# Patient Record
Sex: Male | Born: 1960 | Race: White | Hispanic: No | Smoking: Former smoker
Health system: Southern US, Community
[De-identification: ages and names within clinical notes are randomized; demographics above are authoritative.]

## PROBLEM LIST (undated history)

## (undated) DIAGNOSIS — I10 Essential (primary) hypertension: Secondary | ICD-10-CM

## (undated) DIAGNOSIS — Z972 Presence of dental prosthetic device (complete) (partial): Secondary | ICD-10-CM

## (undated) DIAGNOSIS — Z9289 Personal history of other medical treatment: Secondary | ICD-10-CM

## (undated) DIAGNOSIS — Z8619 Personal history of other infectious and parasitic diseases: Secondary | ICD-10-CM

## (undated) DIAGNOSIS — R300 Dysuria: Secondary | ICD-10-CM

## (undated) HISTORY — DX: Personal history of other infectious and parasitic diseases: Z86.19

## (undated) HISTORY — DX: Dysuria: R30.0

## (undated) HISTORY — DX: Personal history of other medical treatment: Z92.89

---

## 1978-09-13 DIAGNOSIS — F17210 Nicotine dependence, cigarettes, uncomplicated: Secondary | ICD-10-CM | POA: Insufficient documentation

## 1998-09-13 DIAGNOSIS — I10 Essential (primary) hypertension: Secondary | ICD-10-CM | POA: Insufficient documentation

## 2003-09-14 DIAGNOSIS — F39 Unspecified mood [affective] disorder: Secondary | ICD-10-CM | POA: Insufficient documentation

## 2004-09-13 HISTORY — PX: TIBIA FRACTURE SURGERY: SHX806

## 2004-12-13 ENCOUNTER — Emergency Department: Payer: Self-pay | Admitting: Emergency Medicine

## 2004-12-16 ENCOUNTER — Ambulatory Visit: Payer: Self-pay | Admitting: Unknown Physician Specialty

## 2008-02-01 DIAGNOSIS — K644 Residual hemorrhoidal skin tags: Secondary | ICD-10-CM | POA: Insufficient documentation

## 2008-12-16 DIAGNOSIS — M129 Arthropathy, unspecified: Secondary | ICD-10-CM | POA: Insufficient documentation

## 2009-01-03 DIAGNOSIS — E119 Type 2 diabetes mellitus without complications: Secondary | ICD-10-CM | POA: Insufficient documentation

## 2009-01-03 DIAGNOSIS — E1121 Type 2 diabetes mellitus with diabetic nephropathy: Secondary | ICD-10-CM | POA: Insufficient documentation

## 2009-10-27 DIAGNOSIS — M5430 Sciatica, unspecified side: Secondary | ICD-10-CM | POA: Insufficient documentation

## 2011-05-29 ENCOUNTER — Ambulatory Visit: Payer: Self-pay | Admitting: Family Medicine

## 2011-05-29 DIAGNOSIS — Z9289 Personal history of other medical treatment: Secondary | ICD-10-CM

## 2011-05-29 DIAGNOSIS — M5020 Other cervical disc displacement, unspecified cervical region: Secondary | ICD-10-CM | POA: Insufficient documentation

## 2011-05-29 HISTORY — DX: Personal history of other medical treatment: Z92.89

## 2013-05-03 ENCOUNTER — Ambulatory Visit: Payer: Self-pay | Admitting: Unknown Physician Specialty

## 2013-05-03 LAB — HM COLONOSCOPY

## 2013-05-04 LAB — PATHOLOGY REPORT

## 2013-06-21 ENCOUNTER — Ambulatory Visit: Payer: Self-pay | Admitting: Family Medicine

## 2013-07-14 HISTORY — PX: ANTERIOR CERVICAL DISCECTOMY: SHX1160

## 2014-02-28 LAB — BASIC METABOLIC PANEL
BUN: 12 mg/dL (ref 4–21)
Creatinine: 0.9 mg/dL (ref <=1.3)
Glucose: 136 mg/dL
Potassium: 5.3 mmol/L (ref 3.4–5.3)
Sodium: 138 mmol/L (ref 137–147)

## 2014-02-28 LAB — LIPID PANEL
Cholesterol: 149 mg/dL (ref 0–200)
HDL: 59 mg/dL (ref 35–70)
LDL Cholesterol: 77 mg/dL
LDl/HDL Ratio: 1.3
Triglycerides: 64 mg/dL (ref 40–160)

## 2014-02-28 LAB — PSA: PSA: 0.4

## 2014-08-19 LAB — HEMOGLOBIN A1C: Hgb A1c MFr Bld: 5.3 % (ref 4.0–6.0)

## 2014-12-04 LAB — TSH: TSH: 0.92 u[IU]/mL (ref <=5.90)

## 2015-01-02 DIAGNOSIS — N529 Male erectile dysfunction, unspecified: Secondary | ICD-10-CM | POA: Insufficient documentation

## 2015-01-02 DIAGNOSIS — L089 Local infection of the skin and subcutaneous tissue, unspecified: Secondary | ICD-10-CM | POA: Insufficient documentation

## 2015-01-02 DIAGNOSIS — G4700 Insomnia, unspecified: Secondary | ICD-10-CM | POA: Insufficient documentation

## 2015-01-02 DIAGNOSIS — J309 Allergic rhinitis, unspecified: Secondary | ICD-10-CM | POA: Insufficient documentation

## 2015-01-02 DIAGNOSIS — R809 Proteinuria, unspecified: Secondary | ICD-10-CM | POA: Insufficient documentation

## 2015-01-02 DIAGNOSIS — T148XXA Other injury of unspecified body region, initial encounter: Secondary | ICD-10-CM

## 2015-01-03 ENCOUNTER — Encounter: Payer: Self-pay | Admitting: Family Medicine

## 2015-01-03 DIAGNOSIS — Z86010 Personal history of colonic polyps: Secondary | ICD-10-CM | POA: Insufficient documentation

## 2015-01-03 DIAGNOSIS — M5412 Radiculopathy, cervical region: Secondary | ICD-10-CM | POA: Insufficient documentation

## 2015-02-18 ENCOUNTER — Encounter: Payer: Self-pay | Admitting: Family Medicine

## 2015-02-18 ENCOUNTER — Ambulatory Visit (INDEPENDENT_AMBULATORY_CARE_PROVIDER_SITE_OTHER): Payer: BLUE CROSS/BLUE SHIELD | Admitting: Family Medicine

## 2015-02-18 VITALS — BP 118/70 | HR 79 | Temp 98.5°F | Resp 16 | Wt 174.0 lb

## 2015-02-18 DIAGNOSIS — E119 Type 2 diabetes mellitus without complications: Secondary | ICD-10-CM

## 2015-02-18 LAB — POCT GLYCOSYLATED HEMOGLOBIN (HGB A1C)
Est. average glucose Bld gHb Est-mCnc: 105
Hemoglobin A1C: 5.3

## 2015-02-18 NOTE — Progress Notes (Signed)
Patient: Antonio Howe Male    DOB: 16-Jan-1961   54 y.o.   MRN: [id number] Visit Date: 02/18/2015  Today's Provider: Lelon Huh, MD   Chief Complaint  Patient presents with  . Hypertension    6 month follow-up  . Diabetes    6 month follow-up   Subjective:    Hypertension This is a chronic problem. The current episode started more than 1 year ago. The problem is unchanged. The problem is controlled. Associated symptoms include malaise/fatigue. Pertinent negatives include no chest pain, headaches or shortness of breath. The current treatment provides moderate improvement.  Diabetes Pertinent negatives for hypoglycemia include no headaches. Pertinent negatives for diabetes include no chest pain.       Hypertension, follow-up: BP Readings from Last 3 Encounters:  02/18/15 118/70  12/04/14 120/78     He was last seen for hypertension 1 years ago.   BP at that visit was 128/72.  Management changes since that visit include none. He reports good compliance with treatment.  He is not having side effects. none  He is not exercising.  He is not adherent to low salt diet.    Outside blood pressures are 124/72.  He is experiencing none.   Patient denies none.    Cardiovascular risk factors include diabetes mellitus, dyslipidemia and hypertension.  Use of agents associated with hypertension: none.    Weight trend: stable Current diet: in general, a "healthy" diet    ------------------------------------------------------------------------     Diabetes Mellitus Type II, Follow-up:   Lab Results  Component Value Date   HGBA1C 5.3 08/19/2014    Last seen for diabetes 6 months ago.  Management changes included none. He reports good compliance with treatment. He is not having side effects. none Current symptoms include none.  Episodes of hypoglycemia? no   Current diet: diabetic Current exercise: none  Pertinent Labs:    Component Value Date/Time   CHOL 149  02/28/2014   TRIG 64 02/28/2014   CREATININE 0.9 02/28/2014    Wt Readings from Last 3 Encounters:  02/18/15 174 lb (78.926 kg)  12/04/14 165 lb (74.844 kg)    ------------------------------------------------------------------------    Previous Medications   ASPIRIN 81 MG TABLET    Take 1 tablet by mouth daily.   B COMPLEX VITAMINS PO    Take 1 capsule by mouth daily.   CYANOCOBALAMIN 100 MCG TABLET    Take 1 tablet by mouth daily.   DIAZEPAM (VALIUM) 5 MG TABLET    Take 1-2 tablets by mouth daily as needed.   GLIPIZIDE (GLUCOTROL XL) 2.5 MG 24 HR TABLET    Take 1 tablet by mouth daily.   GLUCOSE BLOOD TEST STRIP    1 strip by Does not apply route daily.   HYDROCODONE-ACETAMINOPHEN (NORCO) 7.5-325 MG PER TABLET    Take 1 tablet by mouth every 4 (four) hours as needed.   LISINOPRIL-HYDROCHLOROTHIAZIDE (PRINZIDE,ZESTORETIC) 10-12.5 MG PER TABLET    Take 1 tablet by mouth daily.   MELOXICAM (MOBIC) 15 MG TABLET    Take 1 tablet by mouth daily.   SILDENAFIL (REVATIO) 20 MG TABLET    Take 5 tablets by mouth. 30 minutes prior to intercoarse   VARDENAFIL (LEVITRA) 20 MG TABLET    Take 1 tablet by mouth. As needed, no more than one in a day    Review of Systems  Constitutional: Positive for malaise/fatigue.  Respiratory: Negative for shortness of breath.   Cardiovascular: Negative for chest pain.  Neurological:  Negative for headaches.    History  Substance Use Topics  . Smoking status: Current Every Day Smoker -- 1.50 packs/day for 30 years    Types: Cigarettes  . Smokeless tobacco: Not on file  . Alcohol Use: Yes     Comment: Moderate alcohol use   Objective:   BP 118/70 mmHg  Pulse 79  Temp(Src) 98.5 F (36.9 C) (Oral)  Resp 16  Wt 174 lb (78.926 kg)  SpO2 97%  Physical Exam   General Appearance:    Alert, cooperative, no distress  Eyes:    PERRL, conjunctiva/corneas clear, EOM's intact       Lungs:     Clear to auscultation bilaterally, respirations unlabored   Heart:    Regular rate and rhythm  Neurologic:   Awake, alert, oriented x 3. No apparent focal neurological           defect.              Assessment & Plan:     1. Type 2 diabetes mellitus without complication  - POCT HgB A1C  2. Counseled to stop smoking which he states he is not ready to do yet.

## 2015-02-25 ENCOUNTER — Telehealth: Payer: Self-pay

## 2015-02-25 NOTE — Telephone Encounter (Signed)
Patient is requesting refill on Hydrocodone. Please call [phone] when ready for pick up.

## 2015-02-26 MED ORDER — HYDROCODONE-ACETAMINOPHEN 7.5-325 MG PO TABS: 1.0000 | ORAL_TABLET | ORAL | Status: DC | PRN

## 2015-03-27 ENCOUNTER — Other Ambulatory Visit: Payer: Self-pay | Admitting: Family Medicine

## 2015-03-27 NOTE — Telephone Encounter (Signed)
HYDROcodone-acetaminophen (NORCO) 7.5-325 MG per tablet 02/26/15 -- Birdie Sons, MD Take 1 tablet by mouth every 4 (four) hours as needed. refill to be picked up.  Will be out tomorrow.

## 2015-03-28 MED ORDER — HYDROCODONE-ACETAMINOPHEN 7.5-325 MG PO TABS: 1.0000 | ORAL_TABLET | ORAL | Status: DC | PRN

## 2015-03-28 NOTE — Telephone Encounter (Signed)
Pt will be out of his medication today.  Can someone else write this RX for him.  thanks TP

## 2015-03-28 NOTE — Telephone Encounter (Signed)
Printed.  Please notify patient. Thanks.  

## 2015-04-24 ENCOUNTER — Telehealth: Payer: Self-pay | Admitting: Family Medicine

## 2015-04-24 MED ORDER — HYDROCODONE-ACETAMINOPHEN 7.5-325 MG PO TABS: 1.0000 | ORAL_TABLET | ORAL | Status: DC | PRN

## 2015-04-24 NOTE — Telephone Encounter (Signed)
Please review. Thanks!  

## 2015-04-24 NOTE — Telephone Encounter (Signed)
Patient is requesting his HYDROcodone-acetaminophen (NORCO) 7.5-325 MG per tablet

## 2015-05-21 ENCOUNTER — Other Ambulatory Visit: Payer: Self-pay | Admitting: Family Medicine

## 2015-05-21 MED ORDER — HYDROCODONE-ACETAMINOPHEN 7.5-325 MG PO TABS: 1.0000 | ORAL_TABLET | ORAL | Status: DC | PRN

## 2015-05-21 NOTE — Telephone Encounter (Signed)
HYDROcodone-acetaminophen (NORCO) 7.5-325 MG per tablet 04/24/15 -- Birdie Sons, MD Take 1 tablet by mouth every 4 (four) hours as needed.  would like to pick it up early to take to the pharmacy so it will be ready when he gets back from vacation.  He is not out yet.   call back [phone]  Thanks, Con Memos

## 2015-06-20 ENCOUNTER — Other Ambulatory Visit: Payer: Self-pay | Admitting: Family Medicine

## 2015-06-23 ENCOUNTER — Other Ambulatory Visit: Payer: Self-pay | Admitting: Family Medicine

## 2015-06-23 NOTE — Telephone Encounter (Signed)
Pt contacted office for refill request on the following medications: HYDROcodone-acetaminophen (NORCO) 7.5-325 MG per tablet. Pt would like to pick this up this afternoon. Thanks TNP

## 2015-06-24 MED ORDER — HYDROCODONE-ACETAMINOPHEN 7.5-325 MG PO TABS: 1.0000 | ORAL_TABLET | ORAL | Status: DC | PRN

## 2015-07-04 ENCOUNTER — Other Ambulatory Visit: Payer: Self-pay | Admitting: Family Medicine

## 2015-07-05 NOTE — Telephone Encounter (Signed)
Please call in diazepam.  

## 2015-07-06 ENCOUNTER — Other Ambulatory Visit: Payer: Self-pay | Admitting: Family Medicine

## 2015-07-06 NOTE — Telephone Encounter (Signed)
Please call in diazepam.  

## 2015-07-07 NOTE — Telephone Encounter (Signed)
Rx called in to pharmacy. 

## 2015-07-23 ENCOUNTER — Other Ambulatory Visit: Payer: Self-pay | Admitting: Family Medicine

## 2015-07-23 NOTE — Telephone Encounter (Signed)
Pt contacted office for refill request on the following medications: HYDROcodone-acetaminophen (NORCO) 7.5-325 MG tablet. Thanks TNP

## 2015-07-24 MED ORDER — HYDROCODONE-ACETAMINOPHEN 7.5-325 MG PO TABS: 1.0000 | ORAL_TABLET | ORAL | Status: DC | PRN

## 2015-08-21 ENCOUNTER — Other Ambulatory Visit: Payer: Self-pay | Admitting: Family Medicine

## 2015-08-21 MED ORDER — HYDROCODONE-ACETAMINOPHEN 7.5-325 MG PO TABS: 1.0000 | ORAL_TABLET | ORAL | Status: DC | PRN

## 2015-08-21 NOTE — Telephone Encounter (Signed)
Pt contacted office for refill request on the following medications:  HYDROcodone-acetaminophen (NORCO) 7.5-325 MG tablet.  CB#[phone]/MW

## 2015-09-17 ENCOUNTER — Other Ambulatory Visit: Payer: Self-pay | Admitting: Family Medicine

## 2015-09-17 NOTE — Telephone Encounter (Signed)
Pt contacted office for refill request on the following medications: HYDROcodone-acetaminophen (NORCO) 7.5-325 MG tablet. Pt would like to pick this up by Friday 09/19/15 b/c he will run out over the weekend. Thanks TNP

## 2015-09-18 MED ORDER — HYDROCODONE-ACETAMINOPHEN 7.5-325 MG PO TABS: 1.0000 | ORAL_TABLET | ORAL | Status: DC | PRN

## 2015-09-20 ENCOUNTER — Other Ambulatory Visit: Payer: Self-pay | Admitting: Family Medicine

## 2015-09-23 ENCOUNTER — Other Ambulatory Visit: Payer: Self-pay | Admitting: Family Medicine

## 2015-10-13 ENCOUNTER — Other Ambulatory Visit: Payer: Self-pay | Admitting: Family Medicine

## 2015-10-21 ENCOUNTER — Other Ambulatory Visit: Payer: Self-pay | Admitting: Family Medicine

## 2015-10-21 MED ORDER — HYDROCODONE-ACETAMINOPHEN 7.5-325 MG PO TABS: 1.0000 | ORAL_TABLET | ORAL | Status: DC | PRN

## 2015-10-21 NOTE — Telephone Encounter (Signed)
Pr needs refill HYDROcodone-acetaminophen (NORCO) 7.5-325 MG tablet   Pt call back is [phone]  Thanks Con Memos

## 2015-11-12 ENCOUNTER — Other Ambulatory Visit: Payer: Self-pay | Admitting: Family Medicine

## 2015-11-13 ENCOUNTER — Other Ambulatory Visit: Payer: Self-pay | Admitting: Family Medicine

## 2015-11-13 NOTE — Telephone Encounter (Signed)
Please call in diazepam.  

## 2015-11-13 NOTE — Telephone Encounter (Signed)
Rx called in to pharmacy. 

## 2015-11-14 ENCOUNTER — Other Ambulatory Visit: Payer: Self-pay | Admitting: Family Medicine

## 2015-11-14 MED ORDER — HYDROCODONE-ACETAMINOPHEN 7.5-325 MG PO TABS: 1.0000 | ORAL_TABLET | ORAL | Status: DC | PRN

## 2015-11-14 NOTE — Telephone Encounter (Signed)
Pt contacted office for refill request on the following medications:  HYDROcodone-acetaminophen (NORCO) 7.5-325 MG tablet.  CB#[phone]/MW

## 2015-11-14 NOTE — Telephone Encounter (Signed)
Already called rx into pharmacy on 11/13/2015.

## 2015-11-26 ENCOUNTER — Encounter: Payer: Self-pay | Admitting: Family Medicine

## 2015-11-26 ENCOUNTER — Ambulatory Visit (INDEPENDENT_AMBULATORY_CARE_PROVIDER_SITE_OTHER): Payer: BLUE CROSS/BLUE SHIELD | Admitting: Family Medicine

## 2015-11-26 VITALS — BP 132/88 | HR 84 | Temp 98.2°F | Resp 16 | Ht 66.5 in | Wt 182.0 lb

## 2015-11-26 DIAGNOSIS — I10 Essential (primary) hypertension: Secondary | ICD-10-CM

## 2015-11-26 DIAGNOSIS — K429 Umbilical hernia without obstruction or gangrene: Secondary | ICD-10-CM | POA: Insufficient documentation

## 2015-11-26 DIAGNOSIS — Z86010 Personal history of colonic polyps: Secondary | ICD-10-CM

## 2015-11-26 DIAGNOSIS — Z720 Tobacco use: Secondary | ICD-10-CM

## 2015-11-26 DIAGNOSIS — M5020 Other cervical disc displacement, unspecified cervical region: Secondary | ICD-10-CM

## 2015-11-26 DIAGNOSIS — L4050 Arthropathic psoriasis, unspecified: Secondary | ICD-10-CM | POA: Diagnosis not present

## 2015-11-26 DIAGNOSIS — E119 Type 2 diabetes mellitus without complications: Secondary | ICD-10-CM | POA: Diagnosis not present

## 2015-11-26 DIAGNOSIS — Z0000 Encounter for general adult medical examination without abnormal findings: Secondary | ICD-10-CM

## 2015-11-26 NOTE — Patient Instructions (Addendum)
   Please quit smoking. We can try a prescription for Zyban or Chantix to help stop smoking.    Please contact your eyecare professional to schedule a routine eye exam
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
## 2015-12-04 ENCOUNTER — Other Ambulatory Visit: Payer: Self-pay | Admitting: Family Medicine

## 2015-12-05 LAB — LIPID PANEL
Chol/HDL Ratio: 3.6 ratio units (ref 0.0–5.0)
Cholesterol, Total: 143 mg/dL (ref 100–199)
HDL: 40 mg/dL (ref >39)
LDL Calculated: 89 mg/dL (ref 0–99)
Triglycerides: 71 mg/dL (ref 0–149)
VLDL Cholesterol Cal: 14 mg/dL (ref 5–40)

## 2015-12-05 LAB — COMPREHENSIVE METABOLIC PANEL
ALT: 35 IU/L (ref 0–44)
AST: 20 IU/L (ref 0–40)
Albumin/Globulin Ratio: 2 (ref 1.2–2.2)
Albumin: 4.7 g/dL (ref 3.5–5.5)
Alkaline Phosphatase: 71 IU/L (ref 39–117)
BUN/Creatinine Ratio: 15 (ref 9–20)
BUN: 13 mg/dL (ref 6–24)
Bilirubin Total: 1.2 mg/dL (ref 0.0–1.2)
CO2: 24 mmol/L (ref 18–29)
Calcium: 9.8 mg/dL (ref 8.7–10.2)
Chloride: 101 mmol/L (ref 96–106)
Creatinine, Ser: 0.88 mg/dL (ref 0.76–1.27)
GFR calc Af Amer: 111 mL/min/{1.73_m2} (ref >59)
GFR calc non Af Amer: 96 mL/min/{1.73_m2} (ref >59)
Globulin, Total: 2.3 g/dL (ref 1.5–4.5)
Glucose: 161 mg/dL — ABNORMAL HIGH (ref 65–99)
Potassium: 5.6 mmol/L — ABNORMAL HIGH (ref 3.5–5.2)
Sodium: 142 mmol/L (ref 134–144)
Total Protein: 7 g/dL (ref 6.0–8.5)

## 2015-12-05 LAB — PSA: Prostate Specific Ag, Serum: 0.4 ng/mL (ref 0.0–4.0)

## 2015-12-05 LAB — HEMOGLOBIN A1C
Est. average glucose Bld gHb Est-mCnc: 148 mg/dL
Hgb A1c MFr Bld: 6.8 % — ABNORMAL HIGH (ref 4.8–5.6)

## 2015-12-08 ENCOUNTER — Telehealth: Payer: Self-pay

## 2015-12-08 NOTE — Telephone Encounter (Signed)
-----   Message from Birdie Sons, MD sent at 12/07/2015  5:06 PM EDT ----- A1c is up slightly to 5.3. Try to cut back on sweets and starches. Continue current medications follow up 6 months.

## 2015-12-08 NOTE — Telephone Encounter (Signed)
Left message to call back  

## 2015-12-08 NOTE — Telephone Encounter (Signed)
Advised patient as below.  

## 2015-12-16 ENCOUNTER — Other Ambulatory Visit: Payer: Self-pay | Admitting: Family Medicine

## 2015-12-16 MED ORDER — HYDROCODONE-ACETAMINOPHEN 7.5-325 MG PO TABS: 1.0000 | ORAL_TABLET | ORAL | Status: DC | PRN

## 2015-12-16 NOTE — Telephone Encounter (Signed)
Pt contacted office for refill request on the following medications: HYDROcodone-acetaminophen (NORCO) 7.5-325 MG tablet. Last written on 11/14/15. Last OV was CPE on 11/26/15. Please advise. Thanks TNP

## 2016-01-14 ENCOUNTER — Other Ambulatory Visit: Payer: Self-pay | Admitting: Family Medicine

## 2016-01-14 NOTE — Telephone Encounter (Signed)
Pt needs refill HYDROcodone-acetaminophen (NORCO) 7.5-325 MG tablet 12/16/15 -- Birdie Sons, MD Take 1 tablet by mouth every 4 (four) hours   Please call when ready  Thanks, teri

## 2016-01-15 ENCOUNTER — Other Ambulatory Visit: Payer: Self-pay | Admitting: Family Medicine

## 2016-01-15 MED ORDER — LISINOPRIL-HYDROCHLOROTHIAZIDE 10-12.5 MG PO TABS: 1.0000 | ORAL_TABLET | Freq: Every day | ORAL | Status: DC

## 2016-01-15 NOTE — Telephone Encounter (Signed)
Pt needs refill HYDROcodone-acetaminophen (NORCO) 7.5-325 MG tablet 12/16/15 -- Birdie Sons, MD Take 1 tablet by mouth every 4 (four) hours as needed.  Thanks, [employer]

## 2016-01-16 MED ORDER — HYDROCODONE-ACETAMINOPHEN 7.5-325 MG PO TABS: 1.0000 | ORAL_TABLET | ORAL | Status: DC | PRN

## 2016-02-11 ENCOUNTER — Other Ambulatory Visit: Payer: Self-pay | Admitting: Family Medicine

## 2016-02-11 NOTE — Telephone Encounter (Signed)
Pt needs refill HYDROcodone-acetaminophen (NORCO) 7.5-325 MG tablet 01/16/16 -- Birdie Sons, MD Take 1 tablet by mouth every 4 (four) hours as needed.  He has not ran out but just wanted to call before the weekend.  Please call when ready to pick up [phone]  Thanks Con Memos

## 2016-02-12 MED ORDER — HYDROCODONE-ACETAMINOPHEN 7.5-325 MG PO TABS: 1.0000 | ORAL_TABLET | ORAL | Status: DC | PRN

## 2016-03-04 ENCOUNTER — Other Ambulatory Visit: Payer: Self-pay | Admitting: Family Medicine

## 2016-03-04 MED ORDER — HYDROCODONE-ACETAMINOPHEN 7.5-325 MG PO TABS: 1.0000 | ORAL_TABLET | ORAL | Status: DC | PRN

## 2016-03-11 ENCOUNTER — Telehealth: Payer: Self-pay | Admitting: Family Medicine

## 2016-03-11 NOTE — Telephone Encounter (Signed)
Last fill 02/12/16, last OV march 2017 for CPE=aa

## 2016-03-11 NOTE — Telephone Encounter (Signed)
Pt contacted office for refill request on the following medications:  HYDROcodone-acetaminophen (NORCO) 7.5-325 MG tablet.  CB#[phone]/MW  This is Dr Maralyn Sago pt

## 2016-03-12 NOTE — Telephone Encounter (Signed)
Dr Caryn Section printed out RX before leaving for vacation, RX placed up front and pt advised-aa

## 2016-03-12 NOTE — Telephone Encounter (Signed)
It looks like this was refilled 8 days ago.

## 2016-03-15 ENCOUNTER — Other Ambulatory Visit: Payer: Self-pay | Admitting: Family Medicine

## 2016-03-15 DIAGNOSIS — F39 Unspecified mood [affective] disorder: Secondary | ICD-10-CM

## 2016-03-15 NOTE — Telephone Encounter (Signed)
Please call in diazepam.  

## 2016-03-15 NOTE — Telephone Encounter (Signed)
Rx called in to pharmacy. 

## 2016-04-05 ENCOUNTER — Other Ambulatory Visit: Payer: Self-pay | Admitting: Family Medicine

## 2016-04-05 MED ORDER — HYDROCODONE-ACETAMINOPHEN 7.5-325 MG PO TABS: 1.0000 | ORAL_TABLET | ORAL | 0 refills | Status: DC | PRN

## 2016-04-14 ENCOUNTER — Other Ambulatory Visit: Payer: Self-pay | Admitting: Family Medicine

## 2016-04-14 NOTE — Telephone Encounter (Signed)
Pt contacted office for refill request on the following medications:  HYDROcodone-acetaminophen (NORCO) 7.5-325 MG tablet.  CB#[phone]/MW

## 2016-05-14 ENCOUNTER — Encounter: Payer: Self-pay | Admitting: Family Medicine

## 2016-05-14 ENCOUNTER — Ambulatory Visit (INDEPENDENT_AMBULATORY_CARE_PROVIDER_SITE_OTHER): Payer: BLUE CROSS/BLUE SHIELD | Admitting: Family Medicine

## 2016-05-14 VITALS — BP 126/80 | HR 80 | Temp 98.0°F | Resp 16 | Wt 183.0 lb

## 2016-05-14 DIAGNOSIS — I10 Essential (primary) hypertension: Secondary | ICD-10-CM

## 2016-05-14 DIAGNOSIS — F1190 Opioid use, unspecified, uncomplicated: Secondary | ICD-10-CM

## 2016-05-14 DIAGNOSIS — M5020 Other cervical disc displacement, unspecified cervical region: Secondary | ICD-10-CM

## 2016-05-14 DIAGNOSIS — Z720 Tobacco use: Secondary | ICD-10-CM | POA: Diagnosis not present

## 2016-05-14 DIAGNOSIS — E119 Type 2 diabetes mellitus without complications: Secondary | ICD-10-CM | POA: Diagnosis not present

## 2016-05-14 LAB — POCT GLYCOSYLATED HEMOGLOBIN (HGB A1C)
Est. average glucose Bld gHb Est-mCnc: 148
Hemoglobin A1C: 6.8

## 2016-05-14 MED ORDER — HYDROCODONE-ACETAMINOPHEN 10-325 MG PO TABS: 1.0000 | ORAL_TABLET | Freq: Four times a day (QID) | ORAL | 0 refills | Status: DC | PRN

## 2016-05-14 NOTE — Patient Instructions (Signed)
Screening for lung cancer is recommended for people between 55 and 57 years of age who have smoked at 1 pack per day or more for at least 30 years. Please call our office at [phone] to schedule a low dose CT lung scan for lung cancer screening.   

## 2016-05-14 NOTE — Progress Notes (Signed)
Patient: Antonio Howe Male    DOB: [birth year]   55 y.o.   MRN: [id number] Visit Date: 05/14/2016  Today's Provider: Lelon Huh, MD   Chief Complaint  Patient presents with  . Diabetes    follow up  . Hypertension    follow up  . Neck Pain    follow up   Subjective:    HPI  Diabetes Mellitus Type II, Follow-up:   Lab Results  Component Value Date   HGBA1C 6.8 (H) 12/04/2015   HGBA1C 5.3 02/18/2015   HGBA1C 5.3 08/19/2014    Last seen for diabetes 6 months ago.  Management since then includes .advising patient to cut back on sweet and starchy foods. He reports good compliance with treatment. He is not having side effects.  Current symptoms include none and have been stable. Home blood sugar records: checks blood sugar occasionally. Recent blood sugar of 132  Episodes of hypoglycemia? yes    Current Insulin Regimen: none Most Recent Eye Exam: 1 year ago. Has an appointment to have an eye exam in 1 week. Weight trend: stable Prior visit with dietician: no Current diet: well balanced Current exercise: none  Pertinent Labs:    Component Value Date/Time   CHOL 143 12/04/2015 0804   TRIG 71 12/04/2015 0804   HDL 40 12/04/2015 0804   LDLCALC 89 12/04/2015 0804   CREATININE 0.88 12/04/2015 0804    Wt Readings from Last 3 Encounters:  11/26/15 182 lb (82.6 kg)  02/18/15 174 lb (78.9 kg)  12/04/14 165 lb (74.8 kg)    ------------------------------------------------------------------------  Hypertension, follow-up:  BP Readings from Last 3 Encounters:  11/26/15 132/88  02/18/15 118/70  12/04/14 120/78    He was last seen for hypertension 6 months ago.  BP at that visit was 132/88. Management since that visit includes no changes. He reports good compliance with treatment. He is not having side effects.  He is not exercising. He is not adherent to low salt diet.   Outside blood pressures are checked occasionally and run 130/80. He is  experiencing none.  Patient denies chest pain, chest pressure/discomfort, claudication, dyspnea, exertional chest pressure/discomfort, fatigue, irregular heart beat, lower extremity edema, near-syncope, orthopnea, palpitations, paroxysmal nocturnal dyspnea, syncope and tachypnea.   Cardiovascular risk factors include advanced age (older than 48 for men, 12 for women), dyslipidemia, hypertension and male gender.  Use of agents associated with hypertension: NSAIDS.     Weight trend: stable Wt Readings from Last 3 Encounters:  11/26/15 182 lb (82.6 kg)  02/18/15 174 lb (78.9 kg)  12/04/14 165 lb (74.8 kg)    Current diet: well balanced   Follow up Herniated Cervical Disc: Patient was last seen for this problem 6 months ago and no changes were made. Patient reports good compliance with treatment, good tolerance and good symptom control.     No Known Allergies Current Meds  Medication Sig  . aspirin 81 MG tablet Take 1 tablet by mouth daily.  . B COMPLEX VITAMINS PO Take 1 capsule by mouth daily.  . cyanocobalamin 100 MCG tablet Take 1 tablet by mouth daily.  . diazepam (VALIUM) 5 MG tablet TAKE ONE TO TWO TABLETS BY MOUTH ONCE DAILY AS NEEDED  . GLIPIZIDE XL 2.5 MG 24 hr tablet TAKE 1 TABLET EVERY DAY  . glucose blood test strip 1 strip by Does not apply route daily.  [place] Kitchen HYDROcodone-acetaminophen (NORCO) 7.5-325 MG tablet Take 1 tablet by mouth every 4 (four)  hours as needed.  Marland Kitchen lisinopril-hydrochlorothiazide (PRINZIDE,ZESTORETIC) 10-12.5 MG tablet Take 1 tablet by mouth daily.  . meloxicam (MOBIC) 15 MG tablet Take 1 tablet by mouth daily.  . sildenafil (REVATIO) 20 MG tablet TAKE 5 TABLETS BY MOUTH 30 MINUTES PRIOR TO INTERCOURSE  . vardenafil (LEVITRA) 20 MG tablet Take 1 tablet by mouth. As needed, no more than one in a day    Review of Systems  Constitutional: Negative for appetite change, chills and fever.  Respiratory: Negative for chest tightness, shortness of breath and  wheezing.   Cardiovascular: Negative for chest pain and palpitations.  Gastrointestinal: Negative for abdominal pain, nausea and vomiting.  Endocrine: Negative for cold intolerance, heat intolerance, polydipsia, polyphagia and polyuria.  Musculoskeletal: Positive for arthralgias and neck pain.  Neurological: Positive for numbness (in hands).    Social History  Substance Use Topics  . Smoking status: Current Every Day Smoker    Packs/day: 1.50    Years: 30.00    Types: Cigarettes  . Smokeless tobacco: Never Used  . Alcohol use Yes     Comment: Moderate alcohol use   Objective:   BP 126/80 (BP Location: Right Arm, Patient Position: Sitting, Cuff Size: Normal)   Pulse 80   Temp 98 F (36.7 C) (Oral)   Resp 16   Wt 183 lb (83 kg)   BMI 29.09 kg/m   Physical Exam  General Appearance:    Alert, cooperative, no distress  Eyes:    PERRL, conjunctiva/corneas clear, EOM's intact       Lungs:     Clear to auscultation bilaterally, respirations unlabored  Heart:    Regular rate and rhythm  Neurologic:   Awake, alert, oriented x 3. No apparent focal neurological           defect.       Results for orders placed or performed in visit on 05/14/16  POCT HgB A1C  Result Value Ref Range   Hemoglobin A1C 6.8    Est. average glucose Bld gHb Est-mCnc 148        Assessment & Plan:     1. Type 2 diabetes mellitus without complication, without long-term current use of insulin (HCC) Well controlled.  Continue current medications.   - POCT HgB A1C  2. Herniated cervical disc Pain adequately controlled on current regiment of Norco - HYDROcodone-acetaminophen (NORCO) 10-325 MG tablet; Take 1 tablet by mouth every 6 (six) hours as needed.  Dispense: 120 tablet; Refill: 0  3. Essential (primary) hypertension Well controlled.  Continue current medications.    4. Tobacco use Counseled on benefits of smoking cessation.   5. Chronic, continuous use of opioids  Patient Instructions    Screening for lung cancer is recommended for people between 56 and 72 years of age who have smoked at 1 pack per day or more for at least 24 years. Please call our office at [phone] to schedule a low dose CT lung scan for lung cancer screening.   The entirety of the information documented in the History of Present Illness, Review of Systems and Physical Exam were personally obtained by me. Portions of this information were initially documented by Meyer Cory, CMA and reviewed by me for thoroughness and accuracy.         Lelon Huh, MD  [facility]

## 2016-06-09 ENCOUNTER — Other Ambulatory Visit: Payer: Self-pay | Admitting: Family Medicine

## 2016-06-09 DIAGNOSIS — M5020 Other cervical disc displacement, unspecified cervical region: Secondary | ICD-10-CM

## 2016-06-09 NOTE — Telephone Encounter (Signed)
Pt needs refill on his hydrocodone.  Please call when ready to pick up  Thanks Con Memos

## 2016-06-10 NOTE — Telephone Encounter (Signed)
Pt called back today.  He would like to get the refill before the weekend.  Please advise [phone]  [facility]

## 2016-06-11 ENCOUNTER — Other Ambulatory Visit: Payer: Self-pay

## 2016-06-11 ENCOUNTER — Other Ambulatory Visit: Payer: Self-pay | Admitting: Family Medicine

## 2016-06-11 DIAGNOSIS — M5020 Other cervical disc displacement, unspecified cervical region: Secondary | ICD-10-CM

## 2016-06-11 MED ORDER — HYDROCODONE-ACETAMINOPHEN 10-325 MG PO TABS: 1.0000 | ORAL_TABLET | Freq: Four times a day (QID) | ORAL | 0 refills | Status: DC | PRN

## 2016-06-11 NOTE — Telephone Encounter (Signed)
Previous rx filled on 05/15/16 so won't be due for refill until 06/14/16.

## 2016-06-11 NOTE — Telephone Encounter (Signed)
Here is that request. Thanks ED

## 2016-06-17 ENCOUNTER — Other Ambulatory Visit: Payer: Self-pay | Admitting: Family Medicine

## 2016-07-09 ENCOUNTER — Telehealth: Payer: Self-pay | Admitting: Family Medicine

## 2016-07-09 DIAGNOSIS — M5020 Other cervical disc displacement, unspecified cervical region: Secondary | ICD-10-CM

## 2016-07-09 NOTE — Telephone Encounter (Signed)
Please review. Thanks!  

## 2016-07-09 NOTE — Telephone Encounter (Signed)
Pt contacted office for refill request on the following medications: HYDROcodone-acetaminophen ([place]) 10-325 MG tablet Last written: 06/11/16 Last OV: 05/14/16 Pt stated he would be out by 07/14/16. Please advise. Thanks TNP

## 2016-07-13 ENCOUNTER — Other Ambulatory Visit: Payer: Self-pay | Admitting: Family Medicine

## 2016-07-13 MED ORDER — HYDROCODONE-ACETAMINOPHEN 10-325 MG PO TABS: 1.0000 | ORAL_TABLET | Freq: Four times a day (QID) | ORAL | 0 refills | Status: DC | PRN

## 2016-07-13 NOTE — Telephone Encounter (Signed)
Pt called regarding the refill on his hydrocodone  10-325.  He would like to pick it up this afternoon after 3:00.    He called for a refill on 10/27.  Thanks,  [employer]

## 2016-07-13 NOTE — Telephone Encounter (Signed)
RX for [employer] placed up front for pick up.  Thanks,  -Torianna Junio

## 2016-08-10 ENCOUNTER — Other Ambulatory Visit: Payer: Self-pay | Admitting: Family Medicine

## 2016-08-10 DIAGNOSIS — M5020 Other cervical disc displacement, unspecified cervical region: Secondary | ICD-10-CM

## 2016-08-10 MED ORDER — HYDROCODONE-ACETAMINOPHEN 10-325 MG PO TABS: 1.0000 | ORAL_TABLET | Freq: Four times a day (QID) | ORAL | 0 refills | Status: DC | PRN

## 2016-08-10 NOTE — Telephone Encounter (Signed)
Pt contacted office for refill request on the following medications: ° °HYDROcodone-acetaminophen (NORCO) 10-325 MG tablet  ° °CB#[phone]/MW °

## 2016-08-31 ENCOUNTER — Other Ambulatory Visit: Payer: Self-pay | Admitting: Family Medicine

## 2016-09-15 ENCOUNTER — Telehealth: Payer: Self-pay | Admitting: Family Medicine

## 2016-09-15 DIAGNOSIS — M5020 Other cervical disc displacement, unspecified cervical region: Secondary | ICD-10-CM

## 2016-09-15 MED ORDER — HYDROCODONE-ACETAMINOPHEN 10-325 MG PO TABS: 1.0000 | ORAL_TABLET | Freq: Four times a day (QID) | ORAL | 0 refills | Status: DC | PRN

## 2016-09-15 NOTE — Telephone Encounter (Signed)
Pt contacted office for refill request on the following medications: ° °HYDROcodone-acetaminophen (NORCO) 10-325 MG tablet  ° °CB#[phone]/MW °

## 2016-09-16 ENCOUNTER — Other Ambulatory Visit: Payer: Self-pay | Admitting: Family Medicine

## 2016-09-16 DIAGNOSIS — F39 Unspecified mood [affective] disorder: Secondary | ICD-10-CM

## 2016-09-16 NOTE — Telephone Encounter (Signed)
Called in prescription as written to [facility]. Renaldo Fiddler, CMA

## 2016-09-16 NOTE — Telephone Encounter (Signed)
Please call in alprazolam.  

## 2016-10-14 ENCOUNTER — Other Ambulatory Visit: Payer: Self-pay | Admitting: Family Medicine

## 2016-10-14 DIAGNOSIS — M5020 Other cervical disc displacement, unspecified cervical region: Secondary | ICD-10-CM

## 2016-10-14 NOTE — Telephone Encounter (Signed)
Pt contacted office for refill request on the following medications: HYDROcodone-acetaminophen ([place]) 10-325 MG tablet  Last Rx: 09/15/16 Please advise. Thanks TNP

## 2016-10-15 MED ORDER — HYDROCODONE-ACETAMINOPHEN 10-325 MG PO TABS: 1.0000 | ORAL_TABLET | Freq: Four times a day (QID) | ORAL | 0 refills | Status: DC | PRN

## 2016-11-11 ENCOUNTER — Other Ambulatory Visit: Payer: Self-pay | Admitting: Family Medicine

## 2016-11-11 DIAGNOSIS — M5020 Other cervical disc displacement, unspecified cervical region: Secondary | ICD-10-CM

## 2016-11-11 NOTE — Telephone Encounter (Signed)
Pt needs refill on his    HYDROcodone-acetaminophen (NORCO) 10-325 MG tablet   Pt call back Is [phone]  Thank sTeri

## 2016-11-12 MED ORDER — HYDROCODONE-ACETAMINOPHEN 10-325 MG PO TABS: 1.0000 | ORAL_TABLET | Freq: Four times a day (QID) | ORAL | 0 refills | Status: DC | PRN

## 2016-11-12 NOTE — Telephone Encounter (Signed)
Please advise. Thanks.  

## 2016-11-15 NOTE — Addendum Note (Signed)
Addended by: Ermalinda Barrios on: 11/15/2016 09:37 AM   Modules accepted: Orders

## 2016-11-26 ENCOUNTER — Ambulatory Visit (INDEPENDENT_AMBULATORY_CARE_PROVIDER_SITE_OTHER): Payer: BLUE CROSS/BLUE SHIELD | Admitting: Family Medicine

## 2016-11-26 ENCOUNTER — Encounter: Payer: Self-pay | Admitting: Family Medicine

## 2016-11-26 VITALS — BP 122/70 | HR 80 | Temp 98.0°F | Resp 16 | Ht 66.5 in | Wt 172.0 lb

## 2016-11-26 DIAGNOSIS — Z125 Encounter for screening for malignant neoplasm of prostate: Secondary | ICD-10-CM | POA: Diagnosis not present

## 2016-11-26 DIAGNOSIS — I10 Essential (primary) hypertension: Secondary | ICD-10-CM | POA: Diagnosis not present

## 2016-11-26 DIAGNOSIS — F17210 Nicotine dependence, cigarettes, uncomplicated: Secondary | ICD-10-CM | POA: Diagnosis not present

## 2016-11-26 DIAGNOSIS — Z86010 Personal history of colonic polyps: Secondary | ICD-10-CM

## 2016-11-26 DIAGNOSIS — Z0000 Encounter for general adult medical examination without abnormal findings: Secondary | ICD-10-CM

## 2016-11-26 DIAGNOSIS — Z808 Family history of malignant neoplasm of other organs or systems: Secondary | ICD-10-CM

## 2016-11-26 DIAGNOSIS — Z1159 Encounter for screening for other viral diseases: Secondary | ICD-10-CM | POA: Diagnosis not present

## 2016-11-26 DIAGNOSIS — E119 Type 2 diabetes mellitus without complications: Secondary | ICD-10-CM | POA: Diagnosis not present

## 2016-11-26 NOTE — Patient Instructions (Addendum)
   Recommend taking 81mg  enteric coated aspirin to reduce risk of vascular events such as heart attacks and strokes.     Screening for lung cancer is recommended for people between 50 and 58 years of age who have smoked at 1 pack per day or more for at least 67 years. Please call our office at [phone] to schedule a low dose CT lung scan for lung cancer screening.    Please stop smoking
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
## 2016-12-03 ENCOUNTER — Telehealth: Payer: Self-pay | Admitting: *Deleted

## 2016-12-03 NOTE — Telephone Encounter (Signed)
Left voicemail notifying patient of availability of different days of week for lung screening in April. Encouraged to call back to arrange for screening scan.

## 2016-12-09 ENCOUNTER — Other Ambulatory Visit: Payer: Self-pay | Admitting: Family Medicine

## 2016-12-09 ENCOUNTER — Telehealth: Payer: Self-pay | Admitting: *Deleted

## 2016-12-09 DIAGNOSIS — M5020 Other cervical disc displacement, unspecified cervical region: Secondary | ICD-10-CM

## 2016-12-09 MED ORDER — HYDROCODONE-ACETAMINOPHEN 10-325 MG PO TABS: 1.0000 | ORAL_TABLET | Freq: Four times a day (QID) | ORAL | 0 refills | Status: DC | PRN

## 2016-12-09 NOTE — Telephone Encounter (Signed)
Received referral for low dose lung cancer screening CT scan. Voicemail left at phone number listed in EMR for patient to call me back to facilitate scheduling scan.  

## 2016-12-14 ENCOUNTER — Telehealth: Payer: Self-pay | Admitting: Family Medicine

## 2016-12-14 NOTE — Telephone Encounter (Signed)
Left message for patient to call us back, Dr. Caryn Section just filled this medication on 12/09/16 that patient is to take every 6hrs PRN with a qty of 120tablets. KW

## 2016-12-14 NOTE — Telephone Encounter (Signed)
Pt contacted office for refill request on the following medications:  HYDROcodone-acetaminophen (NORCO) 10-325 MG tablet.  CB#[phone]/MW  This is a pt of Dr Opal Sidles

## 2016-12-14 NOTE — Telephone Encounter (Signed)
Prescription placed up front for pick up. Patient advised.

## 2017-01-25 ENCOUNTER — Other Ambulatory Visit: Payer: Self-pay | Admitting: Family Medicine

## 2017-01-27 ENCOUNTER — Encounter: Payer: Self-pay | Admitting: *Deleted

## 2017-02-17 ENCOUNTER — Other Ambulatory Visit: Payer: Self-pay | Admitting: Family Medicine

## 2017-02-17 DIAGNOSIS — M5020 Other cervical disc displacement, unspecified cervical region: Secondary | ICD-10-CM

## 2017-02-17 NOTE — Telephone Encounter (Signed)
Pt contacted office for refill request on the following medications:  HYDROcodone-acetaminophen (NORCO) 10-325 MG tablet.  CB#[phone]/MW   Pt states he rec'd a lab slip in March to have his labs done and forgot to get the labs done.  Pt is requesting a new lab slip/MW

## 2017-02-17 NOTE — Telephone Encounter (Signed)
Ok to order new lab slip? Please advise. Thanks!

## 2017-02-18 MED ORDER — HYDROCODONE-ACETAMINOPHEN 10-325 MG PO TABS: 1.0000 | ORAL_TABLET | Freq: Four times a day (QID) | ORAL | 0 refills | Status: DC | PRN

## 2017-02-23 DIAGNOSIS — Z125 Encounter for screening for malignant neoplasm of prostate: Secondary | ICD-10-CM | POA: Diagnosis not present

## 2017-02-23 DIAGNOSIS — I10 Essential (primary) hypertension: Secondary | ICD-10-CM | POA: Diagnosis not present

## 2017-02-23 DIAGNOSIS — Z0000 Encounter for general adult medical examination without abnormal findings: Secondary | ICD-10-CM | POA: Diagnosis not present

## 2017-02-23 DIAGNOSIS — E119 Type 2 diabetes mellitus without complications: Secondary | ICD-10-CM | POA: Diagnosis not present

## 2017-02-24 DIAGNOSIS — L578 Other skin changes due to chronic exposure to nonionizing radiation: Secondary | ICD-10-CM | POA: Diagnosis not present

## 2017-02-24 DIAGNOSIS — L570 Actinic keratosis: Secondary | ICD-10-CM | POA: Diagnosis not present

## 2017-02-24 DIAGNOSIS — Z808 Family history of malignant neoplasm of other organs or systems: Secondary | ICD-10-CM | POA: Diagnosis not present

## 2017-02-24 LAB — COMPREHENSIVE METABOLIC PANEL
ALT: 28 IU/L (ref 0–44)
AST: 16 IU/L (ref 0–40)
Albumin/Globulin Ratio: 1.8 (ref 1.2–2.2)
Albumin: 4.6 g/dL (ref 3.5–5.5)
Alkaline Phosphatase: 65 IU/L (ref 39–117)
BUN/Creatinine Ratio: 17 (ref 9–20)
BUN: 13 mg/dL (ref 6–24)
Bilirubin Total: 1.2 mg/dL (ref 0.0–1.2)
CO2: 26 mmol/L (ref 20–29)
Calcium: 9.8 mg/dL (ref 8.7–10.2)
Chloride: 98 mmol/L (ref 96–106)
Creatinine, Ser: 0.75 mg/dL — ABNORMAL LOW (ref 0.76–1.27)
GFR calc Af Amer: 117 mL/min/{1.73_m2} (ref >59)
GFR calc non Af Amer: 101 mL/min/{1.73_m2} (ref >59)
Globulin, Total: 2.5 g/dL (ref 1.5–4.5)
Glucose: 150 mg/dL — ABNORMAL HIGH (ref 65–99)
Potassium: 5.1 mmol/L (ref 3.5–5.2)
Sodium: 138 mmol/L (ref 134–144)
Total Protein: 7.1 g/dL (ref 6.0–8.5)

## 2017-02-24 LAB — HEMOGLOBIN A1C
Est. average glucose Bld gHb Est-mCnc: 120 mg/dL
Hgb A1c MFr Bld: 5.8 % — ABNORMAL HIGH (ref 4.8–5.6)

## 2017-02-24 LAB — PSA: Prostate Specific Ag, Serum: 0.6 ng/mL (ref 0.0–4.0)

## 2017-02-24 LAB — LIPID PANEL
Chol/HDL Ratio: 2.5 ratio (ref 0.0–5.0)
Cholesterol, Total: 145 mg/dL (ref 100–199)
HDL: 57 mg/dL (ref >39)
LDL Calculated: 77 mg/dL (ref 0–99)
Triglycerides: 54 mg/dL (ref 0–149)
VLDL Cholesterol Cal: 11 mg/dL (ref 5–40)

## 2017-02-24 LAB — HEPATITIS C ANTIBODY: Hep C Virus Ab: <0.1 s/co ratio (ref 0.0–0.9)

## 2017-03-21 ENCOUNTER — Other Ambulatory Visit: Payer: Self-pay | Admitting: Family Medicine

## 2017-03-21 DIAGNOSIS — M5020 Other cervical disc displacement, unspecified cervical region: Secondary | ICD-10-CM

## 2017-03-21 MED ORDER — HYDROCODONE-ACETAMINOPHEN 10-325 MG PO TABS: 1.0000 | ORAL_TABLET | Freq: Four times a day (QID) | ORAL | 0 refills | Status: DC | PRN

## 2017-03-21 NOTE — Telephone Encounter (Signed)
Pt needs refill on his  HYDROcodone-acetaminophen (NORCO) 10-325 MG tablet  Pt's call back I [phone]  Thanks Con Memos

## 2017-03-21 NOTE — Telephone Encounter (Signed)
Last fill was 02/18/17-aa

## 2017-04-06 ENCOUNTER — Other Ambulatory Visit: Payer: Self-pay | Admitting: Family Medicine

## 2017-04-06 DIAGNOSIS — F39 Unspecified mood [affective] disorder: Secondary | ICD-10-CM

## 2017-04-06 NOTE — Telephone Encounter (Signed)
Please call in clonazepam  

## 2017-04-07 NOTE — Telephone Encounter (Signed)
Rx called in to pharmacy. 

## 2017-05-05 ENCOUNTER — Other Ambulatory Visit: Payer: Self-pay | Admitting: Family Medicine

## 2017-05-05 DIAGNOSIS — M5020 Other cervical disc displacement, unspecified cervical region: Secondary | ICD-10-CM

## 2017-05-05 MED ORDER — HYDROCODONE-ACETAMINOPHEN 10-325 MG PO TABS
1.0000 | ORAL_TABLET | Freq: Four times a day (QID) | ORAL | 0 refills | Status: DC | PRN
Start: 2017-05-05 — End: 2017-06-03

## 2017-05-05 NOTE — Telephone Encounter (Signed)
Pt contacted office for refill request on the following medications:  HYDROcodone-acetaminophen (NORCO) 10-325 MG tablet   CB#[phone]/MW

## 2017-06-02 ENCOUNTER — Other Ambulatory Visit: Payer: Self-pay | Admitting: Family Medicine

## 2017-06-02 DIAGNOSIS — M5020 Other cervical disc displacement, unspecified cervical region: Secondary | ICD-10-CM

## 2017-06-02 NOTE — Telephone Encounter (Signed)
Pt contacted office for refill request on the following medications:  HYDROcodone-acetaminophen (NORCO) 10-325 MG tablet  CB#[phone]/MW

## 2017-06-03 MED ORDER — HYDROCODONE-ACETAMINOPHEN 10-325 MG PO TABS: 1.0000 | ORAL_TABLET | Freq: Four times a day (QID) | ORAL | 0 refills | Status: DC | PRN

## 2017-06-03 NOTE — Telephone Encounter (Signed)
Last fill was 05/05/17-Anastasiya Estell Harpin, RMA

## 2017-06-28 ENCOUNTER — Ambulatory Visit: Payer: Self-pay | Admitting: Family Medicine

## 2017-07-04 ENCOUNTER — Ambulatory Visit (INDEPENDENT_AMBULATORY_CARE_PROVIDER_SITE_OTHER): Payer: BLUE CROSS/BLUE SHIELD | Admitting: Family Medicine

## 2017-07-04 ENCOUNTER — Encounter: Payer: Self-pay | Admitting: Family Medicine

## 2017-07-04 VITALS — BP 128/82 | HR 90 | Temp 98.4°F | Resp 16 | Wt 174.0 lb

## 2017-07-04 DIAGNOSIS — F17210 Nicotine dependence, cigarettes, uncomplicated: Secondary | ICD-10-CM | POA: Diagnosis not present

## 2017-07-04 DIAGNOSIS — I10 Essential (primary) hypertension: Secondary | ICD-10-CM | POA: Diagnosis not present

## 2017-07-04 DIAGNOSIS — E119 Type 2 diabetes mellitus without complications: Secondary | ICD-10-CM | POA: Diagnosis not present

## 2017-07-04 LAB — POCT GLYCOSYLATED HEMOGLOBIN (HGB A1C)
Est. average glucose Bld gHb Est-mCnc: 108
Hemoglobin A1C: 5.4

## 2017-07-04 LAB — POCT UA - MICROALBUMIN: Microalbumin Ur, POC: 50 mg/L

## 2017-07-04 MED ORDER — BUPROPION HCL ER (SR) 150 MG PO TB12: ORAL_TABLET | ORAL | 5 refills | Status: DC

## 2017-07-04 NOTE — Progress Notes (Signed)
Patient: Antonio Howe Male    DOB: Dec 16, 1960   56 y.o.   MRN: [id number] Visit Date: 07/04/2017  Today's Provider: Lelon Huh, MD   Chief Complaint  Patient presents with  . Hypertension    follow up  . Diabetes    follow up   Subjective:    HPI  Hypertension, follow-up:  BP Readings from Last 3 Encounters:  11/26/16 122/70  05/14/16 126/80  11/26/15 132/88    He was last seen for hypertension 7 months ago.  BP at that visit was 122/70. Management since that visit includes no changes. He reports good compliance with treatment. He is not having side effects.  He is not exercising. He is adherent to low salt diet.   Outside blood pressures are not being checked. He is experiencing none.  Patient denies chest pain, chest pressure/discomfort, claudication, dyspnea, exertional chest pressure/discomfort, fatigue, irregular heart beat, lower extremity edema, near-syncope, orthopnea, palpitations, paroxysmal nocturnal dyspnea, syncope and tachypnea.   Cardiovascular risk factors include advanced age (older than 70 for men, 68 for women), diabetes mellitus, hypertension and male gender.  Use of agents associated with hypertension: NSAIDS.     Weight trend: fluctuating a bit Wt Readings from Last 3 Encounters:  11/26/16 172 lb (78 kg)  05/14/16 183 lb (83 kg)  11/26/15 182 lb (82.6 kg)    Current diet: in general, a "healthy" diet    ------------------------------------------------------------------------  Diabetes Mellitus Type II, Follow-up:   Lab Results  Component Value Date   HGBA1C 5.8 (H) 02/23/2017   HGBA1C 6.8 05/14/2016   HGBA1C 6.8 (H) 12/04/2015    Last seen for diabetes 7 months ago.  Management since then includes no changes. He reports good compliance with treatment. He is not having side effects.  Current symptoms include none and have been stable. Home blood sugar records: 95-110 in the evenings  Episodes of hypoglycemia?  no   Current Insulin Regimen: none Most Recent Eye Exam: >1 year ago Weight trend: fluctuating a bit Prior visit with dietician: no Current diet: in general, a "healthy" diet   Current exercise: none  Pertinent Labs:    Component Value Date/Time   CHOL 145 02/23/2017 0805   TRIG 54 02/23/2017 0805   HDL 57 02/23/2017 0805   LDLCALC 77 02/23/2017 0805   CREATININE 0.75 (L) 02/23/2017 0805    Wt Readings from Last 3 Encounters:  11/26/16 172 lb (78 kg)  05/14/16 183 lb (83 kg)  11/26/15 182 lb (82.6 kg)    ------------------------------------------------------------------------     No Known Allergies   Current Outpatient Prescriptions:  .  aspirin 81 MG tablet, Take 1 tablet by mouth daily., Disp: , Rfl:  .  B COMPLEX VITAMINS PO, Take 1 capsule by mouth daily., Disp: , Rfl:  .  cyanocobalamin 100 MCG tablet, Take 1 tablet by mouth daily., Disp: , Rfl:  .  diazepam (VALIUM) 5 MG tablet, TAKE TWO TABLETS BY MOUTH ONCE DAILY AS NEEDED, Disp: 50 tablet, Rfl: 5 .  glipiZIDE (GLUCOTROL XL) 2.5 MG 24 hr tablet, take 1 tablet by mouth once daily, Disp: 30 tablet, Rfl: 12 .  glucose blood test strip, 1 strip by Does not apply route daily., Disp: , Rfl:  .  HYDROcodone-acetaminophen (NORCO) 10-325 MG tablet, Take 1 tablet by mouth every 6 (six) hours as needed., Disp: 120 tablet, Rfl: 0 .  lisinopril-hydrochlorothiazide (PRINZIDE,ZESTORETIC) 10-12.5 MG tablet, take 1 tablet by mouth daily, Disp: 30 tablet,  Rfl: 6 .  sildenafil (REVATIO) 20 MG tablet, TAKE 5 TABLETS BY MOUTH 30 MINUTES PRIOR TO INTERCOURSE, Disp: 50 tablet, Rfl: 3 .  vardenafil (LEVITRA) 20 MG tablet, Take 1 tablet by mouth. As needed, no more than one in a day, Disp: , Rfl:   Review of Systems  Constitutional: Negative for appetite change, chills and fever.  Respiratory: Negative for chest tightness, shortness of breath and wheezing.   Cardiovascular: Negative for chest pain and palpitations.  Gastrointestinal:  Negative for abdominal pain, nausea and vomiting.    Social History  Substance Use Topics  . Smoking status: Current Every Day Smoker    Packs/day: 1.50    Years: 30.00    Types: Cigarettes  . Smokeless tobacco: Never Used     Comment: started smoking at age 47  . Alcohol use Yes     Comment: Moderate alcohol use   Objective:   BP 128/82 (BP Location: Right Arm, Patient Position: Sitting, Cuff Size: Large)   Pulse 90   Temp 98.4 F (36.9 C) (Oral)   Resp 16   Wt 174 lb (78.9 kg)   SpO2 98% Comment: room air  BMI 27.66 kg/m  There were no vitals filed for this visit.   Physical Exam   General Appearance:    Alert, cooperative, no distress  Eyes:    PERRL, conjunctiva/corneas clear, EOM's intact       Lungs:     Clear to auscultation bilaterally, respirations unlabored  Heart:    Regular rate and rhythm  Neurologic:   Awake, alert, oriented x 3. No apparent focal neurological           defect.       Results for orders placed or performed in visit on 07/04/17  POCT HgB A1C  Result Value Ref Range   Hemoglobin A1C 5.4    Est. average glucose Bld gHb Est-mCnc 108   POCT UA - Microalbumin  Result Value Ref Range   Microalbumin Ur, POC 50 mg/L   Creatinine, POC n/a mg/dL   Albumin/Creatinine Ratio, Urine, POC n/a        Assessment & Plan:     1. Type 2 diabetes mellitus without complication, without long-term current use of insulin (HCC) Well controlled.  Continue current medications.   - POCT HgB A1C - POCT UA - Microalbumin  2. Essential (primary) hypertension Stable, Continue current medications.    3. Smoking greater than 30 pack years Encouraged smoking cesstion.  - buPROPion (WELLBUTRIN SR) 150 MG 12 hr tablet; 1 tablet daily for 3 days, then 1 tablet twice daily. Stop smoking 14 days after starting medication  Dispense: 60 tablet; Refill: 5   Follow up 6 months.       Lelon Huh, MD  [facility]

## 2017-07-06 ENCOUNTER — Telehealth: Payer: Self-pay

## 2017-07-06 DIAGNOSIS — M5020 Other cervical disc displacement, unspecified cervical region: Secondary | ICD-10-CM

## 2017-07-06 MED ORDER — HYDROCODONE-ACETAMINOPHEN 10-325 MG PO TABS: 1.0000 | ORAL_TABLET | Freq: Four times a day (QID) | ORAL | 0 refills | Status: DC | PRN

## 2017-07-06 NOTE — Telephone Encounter (Signed)
Patient calling for medication Refill.  Medication:HYDROcodone-acetaminophen (NORCO) 10-325 MG tablet  CB# [phone]  Thanks,  -Joseline

## 2017-08-08 ENCOUNTER — Other Ambulatory Visit: Payer: Self-pay | Admitting: Family Medicine

## 2017-08-08 DIAGNOSIS — M5020 Other cervical disc displacement, unspecified cervical region: Secondary | ICD-10-CM

## 2017-08-08 NOTE — Telephone Encounter (Signed)
Patient needs a refill on HYDROcodone-acetaminophen (NORCO) 10-325 MG tablet.  He uses [facility]

## 2017-08-09 MED ORDER — HYDROCODONE-ACETAMINOPHEN 10-325 MG PO TABS: 1.0000 | ORAL_TABLET | Freq: Four times a day (QID) | ORAL | 0 refills | Status: DC | PRN

## 2017-08-09 NOTE — Telephone Encounter (Signed)
Please advise patient that hydrocodone/apap  prescription has been sent electronically to  [facility], [address] [place] [address] Phone: [phone] Fax: [phone]

## 2017-08-10 NOTE — Telephone Encounter (Signed)
Patient was advised.  

## 2017-08-29 ENCOUNTER — Other Ambulatory Visit: Payer: Self-pay | Admitting: Family Medicine

## 2017-09-09 ENCOUNTER — Other Ambulatory Visit: Payer: Self-pay | Admitting: Family Medicine

## 2017-09-09 DIAGNOSIS — M5020 Other cervical disc displacement, unspecified cervical region: Secondary | ICD-10-CM

## 2017-09-09 NOTE — Telephone Encounter (Signed)
Pt requesting a refill of Hydrocodone sent into [facility] on [address] would like confirmation once it is sent in.

## 2017-09-10 MED ORDER — HYDROCODONE-ACETAMINOPHEN 10-325 MG PO TABS: 1.0000 | ORAL_TABLET | Freq: Four times a day (QID) | ORAL | 0 refills | Status: DC | PRN

## 2017-09-10 NOTE — Telephone Encounter (Signed)
Pt advised on voicemail-Antonio Howe V Giannis Corpuz, RMA

## 2017-09-10 NOTE — Telephone Encounter (Signed)
Please advise patient that hydrocodone/apap  prescription has been sent electronically to    [facility], [address] [place] [address] Phone: [phone] Fax: [phone]

## 2017-10-04 ENCOUNTER — Other Ambulatory Visit: Payer: Self-pay | Admitting: Family Medicine

## 2017-10-04 DIAGNOSIS — F39 Unspecified mood [affective] disorder: Secondary | ICD-10-CM

## 2017-10-05 NOTE — Telephone Encounter (Signed)
Please call in diazepam.  

## 2017-10-05 NOTE — Telephone Encounter (Signed)
Rx called in to pharmacy. 

## 2017-10-14 ENCOUNTER — Other Ambulatory Visit: Payer: Self-pay | Admitting: Family Medicine

## 2017-10-14 DIAGNOSIS — F39 Unspecified mood [affective] disorder: Secondary | ICD-10-CM

## 2017-10-14 DIAGNOSIS — M5020 Other cervical disc displacement, unspecified cervical region: Secondary | ICD-10-CM

## 2017-10-14 MED ORDER — HYDROCODONE-ACETAMINOPHEN 10-325 MG PO TABS: 1.0000 | ORAL_TABLET | Freq: Four times a day (QID) | ORAL | 0 refills | Status: DC | PRN

## 2017-10-14 MED ORDER — DIAZEPAM 5 MG PO TABS: ORAL_TABLET | ORAL | 3 refills | Status: DC

## 2017-10-14 NOTE — Telephone Encounter (Signed)
Also received a fax for Diazepam to be refilled to [facility], he was using [facility] and that is where the last refills were sent in. Thank Edrick Kins, RMA

## 2017-10-14 NOTE — Telephone Encounter (Signed)
Pt requesting refill

## 2017-10-14 NOTE — Telephone Encounter (Signed)
Patient is requesting a refill on the following medication  HYDROcodone-acetaminophen (NORCO) 10-325 MG tablet  [facility] uses [facility]

## 2017-11-11 ENCOUNTER — Telehealth: Payer: Self-pay | Admitting: Family Medicine

## 2017-11-11 DIAGNOSIS — M5020 Other cervical disc displacement, unspecified cervical region: Secondary | ICD-10-CM

## 2017-11-11 MED ORDER — HYDROCODONE-ACETAMINOPHEN 10-325 MG PO TABS: 1.0000 | ORAL_TABLET | Freq: Four times a day (QID) | ORAL | 0 refills | Status: DC | PRN

## 2017-11-11 NOTE — Telephone Encounter (Signed)
Patient is requesting refill on his HYDROcodone-acetaminophen (NORCO) 10-325 MG tablet     [facility]

## 2017-12-01 ENCOUNTER — Ambulatory Visit
Admission: RE | Admit: 2017-12-01 | Discharge: 2017-12-01 | Disposition: A | Discharge status: Home / Self Care | Payer: Worker's Compensation | Source: Ambulatory Visit | Attending: Emergency Medicine | Admitting: Emergency Medicine

## 2017-12-01 ENCOUNTER — Other Ambulatory Visit (HOSPITAL_COMMUNITY): Payer: Self-pay | Admitting: Emergency Medicine

## 2017-12-01 DIAGNOSIS — S8392XA Sprain of unspecified site of left knee, initial encounter: Secondary | ICD-10-CM | POA: Diagnosis not present

## 2017-12-01 DIAGNOSIS — X58XXXA Exposure to other specified factors, initial encounter: Secondary | ICD-10-CM | POA: Insufficient documentation

## 2017-12-16 ENCOUNTER — Other Ambulatory Visit: Payer: Self-pay | Admitting: Family Medicine

## 2017-12-16 DIAGNOSIS — M5020 Other cervical disc displacement, unspecified cervical region: Secondary | ICD-10-CM

## 2017-12-16 MED ORDER — HYDROCODONE-ACETAMINOPHEN 10-325 MG PO TABS: 1.0000 | ORAL_TABLET | Freq: Four times a day (QID) | ORAL | 0 refills | Status: DC | PRN

## 2017-12-16 NOTE — Telephone Encounter (Signed)
Patient is requesting a refill on the following medication ° °HYDROcodone-acetaminophen (NORCO) 10-325 MG tablet ° °[facility] [place] °

## 2018-01-02 ENCOUNTER — Ambulatory Visit: Payer: Self-pay | Admitting: Family Medicine

## 2018-01-09 ENCOUNTER — Ambulatory Visit (INDEPENDENT_AMBULATORY_CARE_PROVIDER_SITE_OTHER): Payer: BLUE CROSS/BLUE SHIELD | Admitting: Family Medicine

## 2018-01-09 ENCOUNTER — Encounter: Payer: Self-pay | Admitting: Family Medicine

## 2018-01-09 VITALS — BP 132/80 | HR 88 | Temp 98.8°F | Resp 18 | Wt 176.0 lb

## 2018-01-09 DIAGNOSIS — F17210 Nicotine dependence, cigarettes, uncomplicated: Secondary | ICD-10-CM

## 2018-01-09 DIAGNOSIS — I10 Essential (primary) hypertension: Secondary | ICD-10-CM

## 2018-01-09 DIAGNOSIS — E119 Type 2 diabetes mellitus without complications: Secondary | ICD-10-CM

## 2018-01-09 DIAGNOSIS — Z794 Long term (current) use of insulin: Secondary | ICD-10-CM | POA: Diagnosis not present

## 2018-01-09 DIAGNOSIS — Z125 Encounter for screening for malignant neoplasm of prostate: Secondary | ICD-10-CM

## 2018-01-09 DIAGNOSIS — R5383 Other fatigue: Secondary | ICD-10-CM

## 2018-01-09 LAB — POCT GLYCOSYLATED HEMOGLOBIN (HGB A1C)
Est. average glucose Bld gHb Est-mCnc: 128
Hemoglobin A1C: 6.1

## 2018-01-09 NOTE — Progress Notes (Signed)
Patient: Antonio Howe Male    DOB: 06/04/1961   57 y.o.   MRN: [id number] Visit Date: 01/09/2018  Today's Provider: Lelon Huh, MD   Chief Complaint  Patient presents with  . Diabetes  . Hypertension   Subjective:    HPI   Diabetes Mellitus Type II, Follow-up:   Lab Results  Component Value Date   HGBA1C 5.4 07/04/2017   HGBA1C 5.8 (H) 02/23/2017   HGBA1C 6.8 05/14/2016   Last seen for diabetes 6 months ago.  Management since then includes; no changes. He reports good compliance with treatment. He is not having side effects.  Current symptoms include none and have been stable. Home blood sugar records: fasting range: 90-130  Episodes of hypoglycemia? no   Current Insulin Regimen: none Most Recent Eye Exam: >1 year ago Weight trend: fluctuating a bit Prior visit with dietician: no Current diet: well balanced Current exercise: none  ------------------------------------------------------------------------   Hypertension, follow-up:  BP Readings from Last 3 Encounters:  07/04/17 128/82  11/26/16 122/70  05/14/16 126/80    He was last seen for hypertension 6 months ago.  BP at that visit was 128/82. Management since that visit includes; no changes.He reports good compliance with treatment. He is not having side effects.  He is not exercising. He is adherent to low salt diet.   Outside blood pressures are not being checked. He is experiencing none.  Patient denies chest pain, chest pressure/discomfort, claudication, dyspnea, exertional chest pressure/discomfort, fatigue, irregular heart beat, lower extremity edema, near-syncope, orthopnea, palpitations, paroxysmal nocturnal dyspnea, syncope and tachypnea.   Cardiovascular risk factors include advanced age (older than 18 for men, 18 for women), diabetes mellitus, hypertension, male gender and smoking/ tobacco exposure.  Use of agents associated with hypertension: NSAIDS.    ------------------------------------------------------------------------  Smoking greater than 30 pack years From 07/04/2017-Encouraged smoking cesstion. given rx for buPROPion (WELLBUTRIN SR) 150 MG 12 hr tablet. Today patient comes in reporting that he tried Bupropion but stopped taking due to it causing forgetfulness. He reports he is still smoking 2 ppd.    He states he has been increasingly fatigued for several months. No dyspnea. He has injured is knee and states he scheduling.   No Known Allergies   Current Outpatient Medications:  .  aspirin 81 MG tablet, Take 1 tablet by mouth daily., Disp: , Rfl:  .  B COMPLEX VITAMINS PO, Take 1 capsule by mouth daily., Disp: , Rfl:  .  cyanocobalamin 100 MCG tablet, Take 1 tablet by mouth daily., Disp: , Rfl:  .  diazepam (VALIUM) 5 MG tablet, take 2 tablets by mouth once daily if needed, Disp: 50 tablet, Rfl: 3 .  glipiZIDE (GLUCOTROL XL) 2.5 MG 24 hr tablet, take 1 tablet by mouth once daily, Disp: 30 tablet, Rfl: 12 .  glucose blood test strip, 1 strip by Does not apply route daily., Disp: , Rfl:  .  HYDROcodone-acetaminophen (NORCO) 10-325 MG tablet, Take 1 tablet by mouth every 6 (six) hours as needed., Disp: 120 tablet, Rfl: 0 .  lisinopril-hydrochlorothiazide (PRINZIDE,ZESTORETIC) 10-12.5 MG tablet, take 1 tablet by mouth once daily, Disp: 30 tablet, Rfl: 11 .  sildenafil (REVATIO) 20 MG tablet, TAKE 5 TABLETS BY MOUTH 30 MINUTES PRIOR TO INTERCOURSE (Patient not taking: Reported on 01/09/2018), Disp: 50 tablet, Rfl: 3 .  vardenafil (LEVITRA) 20 MG tablet, Take 1 tablet by mouth. As needed, no more than one in a day, Disp: , Rfl:   Review  of Systems  Constitutional: Negative for appetite change, chills and fever.  Respiratory: Negative for chest tightness, shortness of breath and wheezing.   Cardiovascular: Negative for chest pain and palpitations.  Gastrointestinal: Negative for abdominal pain, nausea and vomiting.  Musculoskeletal:  Positive for arthralgias (left knee pain ).    Social History   Tobacco Use  . Smoking status: Current Every Day Smoker    Packs/day: 1.50    Years: 30.00    Pack years: 45.00    Types: Cigarettes  . Smokeless tobacco: Never Used  . Tobacco comment: started smoking at age 64  Substance Use Topics  . Alcohol use: Yes    Comment: Moderate alcohol use   Objective:   BP 132/80 (BP Location: Left Arm, Patient Position: Sitting, Cuff Size: Normal)   Pulse 88   Temp 98.8 F (37.1 C) (Oral)   Resp 18   Wt 176 lb (79.8 kg)   SpO2 97% Comment: room air  BMI 27.98 kg/m     Physical Exam  General Appearance:    Alert, cooperative, no distress  HENT:   ENT exam normal, no neck nodes or sinus tenderness  Eyes:    PERRL, conjunctiva/corneas clear, EOM's intact       Lungs:     Clear to auscultation bilaterally, respirations unlabored  Heart:    Regular rate and rhythm  Neurologic:   Awake, alert, oriented x 3. No apparent focal neurological           defect.       Results for orders placed or performed in visit on 01/09/18  POCT HgB A1C  Result Value Ref Range   Hemoglobin A1C 6.1    Est. average glucose Bld gHb Est-mCnc 128         Assessment & Plan:     1. Type 2 diabetes mellitus without complication, with long-term current use of insulin (HCC) Well controlled.  Continue current medications.   - POCT HgB A1C - Lipid panel  2. Essential (primary) hypertension Well controlled.  Continue current medications.   - Lipid panel  3. Smoking greater than 30 pack years Intolerant to chantix and bupropion and not interested in trying more medications. He states he did quit in the past and thinks he can do it again once he is the right frame of mind.   . Prostate cancer screening  - PSA  5. Other fatigue  - Comprehensive metabolic panel - CBC - TSH       Lelon Huh, MD  [facility]

## 2018-01-10 ENCOUNTER — Other Ambulatory Visit: Payer: Self-pay | Admitting: Family Medicine

## 2018-01-10 DIAGNOSIS — M5020 Other cervical disc displacement, unspecified cervical region: Secondary | ICD-10-CM

## 2018-01-10 NOTE — Telephone Encounter (Signed)
Pt contacted office for refill request on the following medications:  HYDROcodone-acetaminophen ([place]) 10-325 MG tablet  [facility]  Last Rx: 12/16/17 LOV: 01/09/18 Please advise. Thanks TNP

## 2018-01-11 NOTE — Telephone Encounter (Signed)
Pt is calling back states he will be out of medication tomorrow.

## 2018-01-12 MED ORDER — HYDROCODONE-ACETAMINOPHEN 10-325 MG PO TABS
1.0000 | ORAL_TABLET | Freq: Four times a day (QID) | ORAL | 0 refills | Status: DC | PRN
Start: 2018-01-12 — End: 2018-02-13

## 2018-01-13 ENCOUNTER — Other Ambulatory Visit: Payer: Self-pay | Admitting: Family Medicine

## 2018-01-13 DIAGNOSIS — M5020 Other cervical disc displacement, unspecified cervical region: Secondary | ICD-10-CM

## 2018-01-13 NOTE — Telephone Encounter (Signed)
Pt called to check on the status of his refill request. Pt was advised the Rx for Hydrocodone was sent to requested pharmacy yesterday 01/12/18. Thanks TNP

## 2018-01-13 NOTE — Telephone Encounter (Signed)
pt needs refill   Hydrocodone 10-325  [facility] [place]  Thanks [employer]

## 2018-01-16 DIAGNOSIS — E119 Type 2 diabetes mellitus without complications: Secondary | ICD-10-CM | POA: Diagnosis not present

## 2018-01-16 DIAGNOSIS — I10 Essential (primary) hypertension: Secondary | ICD-10-CM | POA: Diagnosis not present

## 2018-01-16 DIAGNOSIS — R5383 Other fatigue: Secondary | ICD-10-CM | POA: Diagnosis not present

## 2018-01-16 DIAGNOSIS — Z794 Long term (current) use of insulin: Secondary | ICD-10-CM | POA: Diagnosis not present

## 2018-01-16 DIAGNOSIS — Z125 Encounter for screening for malignant neoplasm of prostate: Secondary | ICD-10-CM | POA: Diagnosis not present

## 2018-01-17 LAB — COMPREHENSIVE METABOLIC PANEL
ALT: 28 IU/L (ref 0–44)
AST: 22 IU/L (ref 0–40)
Albumin/Globulin Ratio: 2.3 — ABNORMAL HIGH (ref 1.2–2.2)
Albumin: 4.8 g/dL (ref 3.5–5.5)
Alkaline Phosphatase: 74 IU/L (ref 39–117)
BUN/Creatinine Ratio: 10 (ref 9–20)
BUN: 9 mg/dL (ref 6–24)
Bilirubin Total: 1 mg/dL (ref 0.0–1.2)
CO2: 24 mmol/L (ref 20–29)
Calcium: 9.3 mg/dL (ref 8.7–10.2)
Chloride: 97 mmol/L (ref 96–106)
Creatinine, Ser: 0.91 mg/dL (ref 0.76–1.27)
GFR calc Af Amer: 106 mL/min/{1.73_m2} (ref >59)
GFR calc non Af Amer: 92 mL/min/{1.73_m2} (ref >59)
Globulin, Total: 2.1 g/dL (ref 1.5–4.5)
Glucose: 145 mg/dL — ABNORMAL HIGH (ref 65–99)
Potassium: 4.9 mmol/L (ref 3.5–5.2)
Sodium: 136 mmol/L (ref 134–144)
Total Protein: 6.9 g/dL (ref 6.0–8.5)

## 2018-01-17 LAB — LIPID PANEL
Chol/HDL Ratio: 2.2 ratio (ref 0.0–5.0)
Cholesterol, Total: 124 mg/dL (ref 100–199)
HDL: 57 mg/dL (ref >39)
LDL Calculated: 58 mg/dL (ref 0–99)
Triglycerides: 44 mg/dL (ref 0–149)
VLDL Cholesterol Cal: 9 mg/dL (ref 5–40)

## 2018-01-17 LAB — CBC
Hematocrit: 48.2 % (ref 37.5–51.0)
Hemoglobin: 16.7 g/dL (ref 13.0–17.7)
MCH: 33.1 pg — ABNORMAL HIGH (ref 26.6–33.0)
MCHC: 34.6 g/dL (ref 31.5–35.7)
MCV: 95 fL (ref 79–97)
Platelets: 293 10*3/uL (ref 150–379)
RBC: 5.05 x10E6/uL (ref 4.14–5.80)
RDW: 12.7 % (ref 12.3–15.4)
WBC: 7.2 10*3/uL (ref 3.4–10.8)

## 2018-01-17 LAB — PSA: Prostate Specific Ag, Serum: 0.7 ng/mL (ref 0.0–4.0)

## 2018-01-17 LAB — TSH: TSH: 1.3 u[IU]/mL (ref 0.450–4.500)

## 2018-01-25 ENCOUNTER — Telehealth: Payer: Self-pay | Admitting: Family Medicine

## 2018-01-25 NOTE — Telephone Encounter (Signed)
Sharyn Lull with [facility] called needing Pt's last lab work.  He is having an MRI tomorrow with contrast and they need his GFR.  She was given verbal results on 5/13 but they need a written results.  Their fax is [phone] Thanks [employer]

## 2018-01-25 NOTE — Telephone Encounter (Signed)
Lab results were faxed

## 2018-01-28 ENCOUNTER — Other Ambulatory Visit: Payer: Self-pay | Admitting: Family Medicine

## 2018-02-13 ENCOUNTER — Other Ambulatory Visit: Payer: Self-pay | Admitting: Family Medicine

## 2018-02-13 DIAGNOSIS — M5020 Other cervical disc displacement, unspecified cervical region: Secondary | ICD-10-CM

## 2018-02-13 NOTE — Telephone Encounter (Signed)
Pt needs refill on his hydrocodone 10-325  [facility] [place]  Thanks [employer]

## 2018-02-14 MED ORDER — HYDROCODONE-ACETAMINOPHEN 10-325 MG PO TABS: 1.0000 | ORAL_TABLET | Freq: Four times a day (QID) | ORAL | 0 refills | Status: DC | PRN

## 2018-02-27 DIAGNOSIS — Z872 Personal history of diseases of the skin and subcutaneous tissue: Secondary | ICD-10-CM | POA: Diagnosis not present

## 2018-02-27 DIAGNOSIS — Z1283 Encounter for screening for malignant neoplasm of skin: Secondary | ICD-10-CM | POA: Diagnosis not present

## 2018-02-27 DIAGNOSIS — L578 Other skin changes due to chronic exposure to nonionizing radiation: Secondary | ICD-10-CM | POA: Diagnosis not present

## 2018-02-27 DIAGNOSIS — L570 Actinic keratosis: Secondary | ICD-10-CM | POA: Diagnosis not present

## 2018-02-27 DIAGNOSIS — D485 Neoplasm of uncertain behavior of skin: Secondary | ICD-10-CM | POA: Diagnosis not present

## 2018-03-14 ENCOUNTER — Other Ambulatory Visit: Payer: Self-pay | Admitting: Family Medicine

## 2018-03-14 DIAGNOSIS — M5020 Other cervical disc displacement, unspecified cervical region: Secondary | ICD-10-CM

## 2018-03-14 DIAGNOSIS — F39 Unspecified mood [affective] disorder: Secondary | ICD-10-CM

## 2018-03-14 MED ORDER — HYDROCODONE-ACETAMINOPHEN 10-325 MG PO TABS
1.0000 | ORAL_TABLET | Freq: Four times a day (QID) | ORAL | 0 refills | Status: DC | PRN
Start: 2018-03-14 — End: 2018-04-06

## 2018-03-14 NOTE — Telephone Encounter (Signed)
Pt needs refill on his hydrocodone 10-325  [facility]  Thanks [place]

## 2018-04-06 ENCOUNTER — Other Ambulatory Visit: Payer: Self-pay | Admitting: Family Medicine

## 2018-04-06 DIAGNOSIS — M5020 Other cervical disc displacement, unspecified cervical region: Secondary | ICD-10-CM

## 2018-04-06 NOTE — Telephone Encounter (Signed)
Patient is requesting a refill on the following medication  HYDROcodone-acetaminophen (NORCO) 10-325 MG tablet  [facility] [place]

## 2018-04-07 MED ORDER — HYDROCODONE-ACETAMINOPHEN 10-325 MG PO TABS: 1.0000 | ORAL_TABLET | Freq: Four times a day (QID) | ORAL | 0 refills | Status: DC | PRN

## 2018-04-10 ENCOUNTER — Other Ambulatory Visit: Payer: Self-pay | Admitting: Family Medicine

## 2018-04-10 DIAGNOSIS — F39 Unspecified mood [affective] disorder: Secondary | ICD-10-CM

## 2018-05-08 ENCOUNTER — Other Ambulatory Visit: Payer: Self-pay | Admitting: Family Medicine

## 2018-05-08 DIAGNOSIS — M5020 Other cervical disc displacement, unspecified cervical region: Secondary | ICD-10-CM

## 2018-05-08 NOTE — Telephone Encounter (Signed)
Pt contacted office for refill request on the following medications:  HYDROcodone-acetaminophen ([place]) 10-325 MG tablet  [facility] [place]  Last Rx: 04/07/18 LOV: 01/09/18 Please advise. Thanks TNP

## 2018-05-10 MED ORDER — HYDROCODONE-ACETAMINOPHEN 10-325 MG PO TABS: 1.0000 | ORAL_TABLET | Freq: Four times a day (QID) | ORAL | 0 refills | Status: DC | PRN

## 2018-06-07 ENCOUNTER — Other Ambulatory Visit: Payer: Self-pay | Admitting: Family Medicine

## 2018-06-07 DIAGNOSIS — M5020 Other cervical disc displacement, unspecified cervical region: Secondary | ICD-10-CM

## 2018-06-07 MED ORDER — HYDROCODONE-ACETAMINOPHEN 10-325 MG PO TABS: 1.0000 | ORAL_TABLET | Freq: Four times a day (QID) | ORAL | 0 refills | Status: DC | PRN

## 2018-06-07 NOTE — Telephone Encounter (Signed)
Patient needs Hydrocodone 10-325 mg. [facility] [place]

## 2018-07-06 ENCOUNTER — Other Ambulatory Visit: Payer: Self-pay | Admitting: Family Medicine

## 2018-07-06 DIAGNOSIS — M5020 Other cervical disc displacement, unspecified cervical region: Secondary | ICD-10-CM

## 2018-07-06 MED ORDER — HYDROCODONE-ACETAMINOPHEN 10-325 MG PO TABS: 1.0000 | ORAL_TABLET | Freq: Four times a day (QID) | ORAL | 0 refills | Status: DC | PRN

## 2018-07-06 NOTE — Telephone Encounter (Signed)
Pt needs refill  Hydrocodone 10-325  [facility]  CB#  [phone]  Thanks  [employer]

## 2018-07-11 ENCOUNTER — Ambulatory Visit: Payer: BLUE CROSS/BLUE SHIELD | Admitting: Family Medicine

## 2018-07-11 ENCOUNTER — Encounter: Payer: Self-pay | Admitting: Family Medicine

## 2018-07-11 VITALS — BP 122/68 | HR 86 | Temp 97.8°F | Resp 16 | Wt 179.8 lb

## 2018-07-11 DIAGNOSIS — Z794 Long term (current) use of insulin: Secondary | ICD-10-CM | POA: Diagnosis not present

## 2018-07-11 DIAGNOSIS — E119 Type 2 diabetes mellitus without complications: Secondary | ICD-10-CM | POA: Diagnosis not present

## 2018-07-11 DIAGNOSIS — I10 Essential (primary) hypertension: Secondary | ICD-10-CM | POA: Diagnosis not present

## 2018-07-11 LAB — POCT GLYCOSYLATED HEMOGLOBIN (HGB A1C): Hemoglobin A1C: 5.7 % — AB (ref 4.0–5.6)

## 2018-07-11 NOTE — Progress Notes (Signed)
Patient: Antonio Howe Male    DOB: [birth year]   57 y.o.   MRN: [id number] Visit Date: 07/11/2018  Today's Provider: Lelon Huh, MD   Chief Complaint  Patient presents with  . Diabetes  . Hypertension  . Mood Disorder   Subjective:    HPI      Diabetes Mellitus Type II, Follow-up:   Lab Results  Component Value Date   HGBA1C 6.1 01/09/2018   HGBA1C 5.4 07/04/2017   HGBA1C 5.8 (H) 02/23/2017   Last seen for diabetes 6 months ago.  Management since then includes none. He reports excellent compliance with treatment. He is not having side effects.  Current symptoms include none and have been stable. Home blood sugar records: 106-111  Episodes of hypoglycemia? no   Current Insulin Regimen: none  Most Recent Eye Exam: 8yrs ago Weight trend: stable Prior visit with dietician: no Current diet: not asked Current exercise: none  Wt Readings from Last 3 Encounters:  07/11/18 179 lb 12.8 oz (81.6 kg)  01/09/18 176 lb (79.8 kg)  07/04/17 174 lb (78.9 kg)    ------------------------------------------------------------------------   Hypertension, follow-up:  BP Readings from Last 3 Encounters:  07/11/18 122/68  01/09/18 132/80  07/04/17 128/82    He was last seen for hypertension 6 months ago.  BP at that visit was 132/80. Management since that visit includes none.He reports excellent compliance with treatment. He is not having side effects.  He is not exercising. He is not adherent to low salt diet.   Outside blood pressures are not being checked. He is experiencing none.  Patient denies chest pain, chest pressure/discomfort, claudication, dyspnea, exertional chest pressure/discomfort, fatigue, irregular heart beat, lower extremity edema, near-syncope, orthopnea, palpitations, paroxysmal nocturnal dyspnea, syncope and tachypnea.   Cardiovascular risk factors include advanced age (older than 47 for men, 56 for women), diabetes mellitus, hypertension  and male gender.  Use of agents associated with hypertension: NSAIDS.   ------------------------------------------------------------------------  Mood Disorder, follow up:  Patient returns to office today for follow up, patient was last seen in office 01/09/18 and condition was stable. Patient was advised to continue Diazepam PRN. Patient reports good compliance, tolerance and symptom control on medication. Patient states that he takes medication once a night and it helps him through the night.    No Known Allergies   Current Outpatient Medications:  .  aspirin 81 MG tablet, Take 1 tablet by mouth daily., Disp: , Rfl:  .  B COMPLEX VITAMINS PO, Take 1 capsule by mouth daily., Disp: , Rfl:  .  cyanocobalamin 100 MCG tablet, Take 1 tablet by mouth daily., Disp: , Rfl:  .  diazepam (VALIUM) 5 MG tablet, TAKE 2 TABLETS BY MOUTH ONCE DAILY IF NEEDED, Disp: 50 tablet, Rfl: 3 .  glipiZIDE (GLUCOTROL XL) 2.5 MG 24 hr tablet, take 1 tablet by mouth once daily, Disp: 30 tablet, Rfl: 12 .  glucose blood test strip, 1 strip by Does not apply route daily., Disp: , Rfl:  .  HYDROcodone-acetaminophen (NORCO) 10-325 MG tablet, Take 1 tablet by mouth every 6 (six) hours as needed., Disp: 120 tablet, Rfl: 0 .  lisinopril-hydrochlorothiazide (PRINZIDE,ZESTORETIC) 10-12.5 MG tablet, TAKE 1 TABLET BY MOUTH DAILY, Disp: 30 tablet, Rfl: 11 .  sildenafil (REVATIO) 20 MG tablet, TAKE 5 TABLETS BY MOUTH 30 MINUTES PRIOR TO INTERCOURSE, Disp: 50 tablet, Rfl: 3 .  vardenafil (LEVITRA) 20 MG tablet, Take 1 tablet by mouth. As needed, no more than one  in a day, Disp: , Rfl:   Review of Systems  Constitutional: Negative for appetite change, chills and fever.  Respiratory: Negative for chest tightness, shortness of breath and wheezing.   Cardiovascular: Negative for chest pain and palpitations.  Gastrointestinal: Negative for abdominal pain, nausea and vomiting.    Social History   Tobacco Use  . Smoking status:  Current Every Day Smoker    Packs/day: 1.50    Years: 30.00    Pack years: 45.00    Types: Cigarettes  . Smokeless tobacco: Never Used  . Tobacco comment: started smoking at age 72  Substance Use Topics  . Alcohol use: Yes    Comment: Moderate alcohol use   Objective:   BP 122/68   Pulse 86   Temp 97.8 F (36.6 C) (Oral)   Resp 16   Wt 179 lb 12.8 oz (81.6 kg)   SpO2 92%   BMI 28.59 kg/m  Vitals:   07/11/18 1608  BP: 122/68  Pulse: 86  Resp: 16  Temp: 97.8 F (36.6 C)  TempSrc: Oral  SpO2: 92%  Weight: 179 lb 12.8 oz (81.6 kg)     Physical Exam   General Appearance:    Alert, cooperative, no distress  Eyes:    PERRL, conjunctiva/corneas clear, EOM's intact       Lungs:     Clear to auscultation bilaterally, respirations unlabored  Heart:    Regular rate and rhythm  Neurologic:   Awake, alert, oriented x 3. No apparent focal neurological           defect.       Results for orders placed or performed in visit on 07/11/18  POCT glycosylated hemoglobin (Hb A1C)  Result Value Ref Range   Hemoglobin A1C 5.7 (A) 4.0 - 5.6 %   HbA1c POC (<> result, manual entry)     HbA1c, POC (prediabetic range)     HbA1c, POC (controlled diabetic range)         Assessment & Plan:     1. Type 2 diabetes mellitus without complication, with long-term current use of insulin (HCC) Very well controlled. Will stop glipizide. He is to check sugar a few times a week and restart glipizide if any sugars >200.  - POCT glycosylated hemoglobin (Hb A1C)  2. Essential (primary) hypertension Well controlled.  Continue current medications.    Return in about 6 months (around 01/03/2019) for Yearly Physical.        Lelon Huh, MD  [facility]

## 2018-07-11 NOTE — Patient Instructions (Signed)
Leet me know if you start seeing blood sugars over 200.

## 2018-08-07 ENCOUNTER — Other Ambulatory Visit: Payer: Self-pay | Admitting: Family Medicine

## 2018-08-07 DIAGNOSIS — M5020 Other cervical disc displacement, unspecified cervical region: Secondary | ICD-10-CM

## 2018-08-07 NOTE — Telephone Encounter (Signed)
Pt needing a refill on: ° °HYDROcodone-acetaminophen (NORCO) 10-325 MG tablet ° °Please fill at: °[id number] [place] [place] - [address] [phone] (Phone) °[phone] (Fax)  ° °Thanks, °[facility] ° °

## 2018-08-08 ENCOUNTER — Other Ambulatory Visit: Payer: Self-pay | Admitting: *Deleted

## 2018-08-08 DIAGNOSIS — M5020 Other cervical disc displacement, unspecified cervical region: Secondary | ICD-10-CM

## 2018-08-08 MED ORDER — HYDROCODONE-ACETAMINOPHEN 10-325 MG PO TABS: 1.0000 | ORAL_TABLET | Freq: Four times a day (QID) | ORAL | 0 refills | Status: DC | PRN

## 2018-08-11 ENCOUNTER — Other Ambulatory Visit: Payer: Self-pay | Admitting: Family Medicine

## 2018-08-11 DIAGNOSIS — M5020 Other cervical disc displacement, unspecified cervical region: Secondary | ICD-10-CM

## 2018-08-11 MED ORDER — HYDROCODONE-ACETAMINOPHEN 10-325 MG PO TABS: 1.0000 | ORAL_TABLET | Freq: Four times a day (QID) | ORAL | 0 refills | Status: DC | PRN

## 2018-08-11 NOTE — Telephone Encounter (Signed)
Will you send [facility] into [facility] [address]? Dr. Caryn Section sent this into [facility] [place] on 08/08/2018, but they are out of stock. Please advise. Thanks.

## 2018-08-11 NOTE — Telephone Encounter (Signed)
Pharmacy out of stock and patient running out of medications. Will cancel prescription sent to the [facility] and reordered at the [facility]. Patient should continue regular follow up with Dr. Caryn Section for further refills.

## 2018-08-11 NOTE — Telephone Encounter (Signed)
Pt needing his HYDROcodone-acetaminophen ([place]) 10-325 MG tablet  [facility]  [address], [place], [place] [address] Phone: [phone]  The [facility] it was [facility] into doesn't have the Rx in stock.  Pt is going out of town tomorrow.  Asking If it can be called in today.  Thanks, [employer]

## 2018-09-07 ENCOUNTER — Other Ambulatory Visit: Payer: Self-pay | Admitting: *Deleted

## 2018-09-07 DIAGNOSIS — M5020 Other cervical disc displacement, unspecified cervical region: Secondary | ICD-10-CM

## 2018-09-07 NOTE — Telephone Encounter (Signed)
Patient is requesting refill for Hydrocodone 10-325 mg sent to [facility].

## 2018-09-08 MED ORDER — HYDROCODONE-ACETAMINOPHEN 10-325 MG PO TABS: 1.0000 | ORAL_TABLET | Freq: Four times a day (QID) | ORAL | 0 refills | Status: DC | PRN

## 2018-10-06 ENCOUNTER — Other Ambulatory Visit: Payer: Self-pay

## 2018-10-06 DIAGNOSIS — M5020 Other cervical disc displacement, unspecified cervical region: Secondary | ICD-10-CM

## 2018-10-06 NOTE — Telephone Encounter (Signed)
Patient requesting refill on Hydrocodone-Acetaminophen, patient request that this be sent to [email or web address]

## 2018-10-07 MED ORDER — HYDROCODONE-ACETAMINOPHEN 10-325 MG PO TABS
1.0000 | ORAL_TABLET | Freq: Four times a day (QID) | ORAL | 0 refills | Status: DC | PRN
Start: 2018-10-07 — End: 2018-10-11

## 2018-10-11 ENCOUNTER — Other Ambulatory Visit: Payer: Self-pay | Admitting: Family Medicine

## 2018-10-11 DIAGNOSIS — M5020 Other cervical disc displacement, unspecified cervical region: Secondary | ICD-10-CM

## 2018-10-11 MED ORDER — HYDROCODONE-ACETAMINOPHEN 10-325 MG PO TABS: 1.0000 | ORAL_TABLET | Freq: Four times a day (QID) | ORAL | 0 refills | Status: DC | PRN

## 2018-10-11 NOTE — Telephone Encounter (Signed)
[facility] and verified that they were out of stock, and patient has not filled prescription. I cancelled prescription that was sent in on 10/07/2018.

## 2018-10-11 NOTE — Telephone Encounter (Signed)
[facility] and verify that the prescription from 10/07/2018 was not dispense and is cancelled before I can send new prescription to [facility].

## 2018-10-11 NOTE — Telephone Encounter (Signed)
Pt could not pick up his Rx of HYDROcodone-acetaminophen (NORCO) 10-325 MG tablet at [facility].  They do not have this in stock.  Pt is asking for it be called into  [facility]  Address: [address], [place], [place] [address]   Phone: [phone]    Thanks, [facility]

## 2018-10-11 NOTE — Telephone Encounter (Signed)
Please review

## 2018-10-17 ENCOUNTER — Other Ambulatory Visit: Payer: Self-pay | Admitting: Family Medicine

## 2018-10-17 DIAGNOSIS — F39 Unspecified mood [affective] disorder: Secondary | ICD-10-CM

## 2018-11-06 ENCOUNTER — Other Ambulatory Visit: Payer: Self-pay | Admitting: Family Medicine

## 2018-11-07 ENCOUNTER — Encounter: Payer: Self-pay | Admitting: Family Medicine

## 2018-11-07 ENCOUNTER — Ambulatory Visit (INDEPENDENT_AMBULATORY_CARE_PROVIDER_SITE_OTHER): Payer: BLUE CROSS/BLUE SHIELD | Admitting: Family Medicine

## 2018-11-07 VITALS — BP 155/78 | HR 96 | Temp 97.8°F | Wt 178.0 lb

## 2018-11-07 DIAGNOSIS — M545 Low back pain, unspecified: Secondary | ICD-10-CM

## 2018-11-07 LAB — POCT URINALYSIS DIPSTICK
Bilirubin, UA: NEGATIVE
Blood, UA: NEGATIVE
Glucose, UA: NEGATIVE
Ketones, UA: NEGATIVE
Leukocytes, UA: NEGATIVE
Nitrite, UA: NEGATIVE
Protein, UA: NEGATIVE
Spec Grav, UA: <=1.005 — AB (ref 1.010–1.025)
Urobilinogen, UA: 0.2 E.U./dL
pH, UA: 6.5 (ref 5.0–8.0)

## 2018-11-07 NOTE — Patient Instructions (Addendum)
. Please review the attached list of medications and notify my office if there are any errors.   . Please stop smoking    Alternate applying heat and ice to sore are of back 3-4 times a day   Acute Back Pain, Adult Acute back pain is sudden and usually short-lived. It is often caused by an injury to the muscles and tissues in the back. The injury may result from:  A muscle or ligament getting overstretched or torn (strained). Ligaments are tissues that connect bones to each other. Lifting something improperly can cause a back strain.  Wear and tear (degeneration) of the spinal disks. Spinal disks are circular tissue that provides cushioning between the bones of the spine (vertebrae).  Twisting motions, such as while playing sports or doing yard work.  A hit to the back.  Arthritis. You may have a physical exam, lab tests, and imaging tests to find the cause of your pain. Acute back pain usually goes away with rest and home care. Follow these instructions at home: Managing pain, stiffness, and swelling  Take over-the-counter and prescription medicines only as told by your health care provider.  Your health care provider may recommend applying ice during the first 24-48 hours after your pain starts. To do this: ? Put ice in a plastic bag. ? Place a towel between your skin and the bag. ? Leave the ice on for 20 minutes, 2-3 times a day.  If directed, apply heat to the affected area as often as told by your health care provider. Use the heat source that your health care provider recommends, such as a moist heat pack or a heating pad. ? Place a towel between your skin and the heat source. ? Leave the heat on for 20-30 minutes. ? Remove the heat if your skin turns bright red. This is especially important if you are unable to feel pain, heat, or cold. You have a greater risk of getting burned. Activity   Do not stay in bed. Staying in bed for more than 1-2 days can delay your  recovery.  Sit up and stand up straight. Avoid leaning forward when you sit, or hunching over when you stand. ? If you work at a desk, sit close to it so you do not need to lean over. Keep your chin tucked in. Keep your neck drawn back, and keep your elbows bent at a right angle. Your arms should look like the letter "L." ? Sit high and close to the steering wheel when you drive. Add lower back (lumbar) support to your car seat, if needed.  Take short walks on even surfaces as soon as you are able. Try to increase the length of time you walk each day.  Do not sit, drive, or stand in one place for more than 30 minutes at a time. Sitting or standing for long periods of time can put stress on your back.  Do not drive or use heavy machinery while taking prescription pain medicine.  Use proper lifting techniques. When you bend and lift, use positions that put less stress on your back: ? Jersey your knees. ? Keep the load close to your body. ? Avoid twisting.  Exercise regularly as told by your health care provider. Exercising helps your back heal faster and helps prevent back injuries by keeping muscles strong and flexible.  Work with a physical therapist to make a safe exercise program, as recommended by your health care provider. Do any exercises as told  by your physical therapist. Lifestyle  Maintain a healthy weight. Extra weight puts stress on your back and makes it difficult to have good posture.  Avoid activities or situations that make you feel anxious or stressed. Stress and anxiety increase muscle tension and can make back pain worse. Learn ways to manage anxiety and stress, such as through exercise. General instructions  Sleep on a firm mattress in a comfortable position. Try lying on your side with your knees slightly bent. If you lie on your back, put a pillow under your knees.  Follow your treatment plan as told by your health care provider. This may include: ? Cognitive or  behavioral therapy. ? Acupuncture or massage therapy. ? Meditation or yoga. Contact a health care provider if:  You have pain that is not relieved with rest or medicine.  You have increasing pain going down into your legs or buttocks.  Your pain does not improve after 2 weeks.  You have pain at night.  You lose weight without trying.  You have a fever or chills. Get help right away if:  You develop new bowel or bladder control problems.  You have unusual weakness or numbness in your arms or legs.  You develop nausea or vomiting.  You develop abdominal pain.  You feel faint. Summary  Acute back pain is sudden and usually short-lived.  Use proper lifting techniques. When you bend and lift, use positions that put less stress on your back.  Take over-the-counter and prescription medicines and apply heat or ice as directed by your health care provider. This information is not intended to replace advice given to you by your health care provider. Make sure you discuss any questions you have with your health care provider. Document Released: 08/30/2005 Document Revised: 04/06/2018 Document Reviewed: 04/13/2017 Elsevier Interactive Patient Education  2019 [employer].

## 2018-11-07 NOTE — Progress Notes (Signed)
Patient: Antonio Howe Male    DOB: Jan 28, 1961   57 y.o.   MRN: [id number] Visit Date: 11/07/2018  Today's Provider: Lelon Huh, MD   Chief Complaint  Patient presents with  . Back Pain    Right side; started about three weeks ago.    Subjective:      Back Pain  This is a new problem. The current episode started 2  weeks ago. The problem occurs intermittently. The pain is present in the lumbar spine (Right sided). The pain does not radiate. The symptoms are aggravated by position. Pertinent negatives include no abdominal pain, bladder incontinence, bowel incontinence, chest pain, dysuria, fever, headaches, leg pain, numbness, paresis, paresthesias, perianal numbness, tingling or weakness.  Has not had any known injury. Is not having any pain now. Has been applying heating pads which seem to help.   No Known Allergies   Current Outpatient Medications:  .  aspirin 81 MG tablet, Take 1 tablet by mouth daily., Disp: , Rfl:  .  B COMPLEX VITAMINS PO, Take 1 capsule by mouth daily., Disp: , Rfl:  .  cyanocobalamin 100 MCG tablet, Take 1 tablet by mouth daily., Disp: , Rfl:  .  diazepam (VALIUM) 5 MG tablet, TAKE 2 TABLETS BY MOUTH ONCE DAILY IF NEEDED, Disp: 50 tablet, Rfl: 3 .  glipiZIDE (GLUCOTROL XL) 2.5 MG 24 hr tablet, TAKE 1 TABLET BY MOUTH DAILY, Disp: 90 tablet, Rfl: 4 .  glucose blood test strip, 1 strip by Does not apply route daily., Disp: , Rfl:  .  HYDROcodone-acetaminophen (NORCO) 10-325 MG tablet, Take 1 tablet by mouth every 6 (six) hours as needed. Reviewed controlled substance use record in PMP Alert., Disp: 120 tablet, Rfl: 0 .  lisinopril-hydrochlorothiazide (PRINZIDE,ZESTORETIC) 10-12.5 MG tablet, TAKE 1 TABLET BY MOUTH DAILY, Disp: 30 tablet, Rfl: 11 .  sildenafil (REVATIO) 20 MG tablet, TAKE 5 TABLETS BY MOUTH 30 MINUTES PRIOR TO INTERCOURSE (Patient not taking: Reported on 11/07/2018), Disp: 50 tablet, Rfl: 3 .  vardenafil (LEVITRA) 20 MG tablet, Take 1  tablet by mouth. As needed, no more than one in a day, Disp: , Rfl:   Review of Systems  Constitutional: Negative.  Negative for fever.  Cardiovascular: Negative for chest pain.  Gastrointestinal: Negative.  Negative for abdominal pain and bowel incontinence.  Genitourinary: Negative for bladder incontinence, dysuria, frequency and hematuria.  Musculoskeletal: Positive for back pain. Negative for arthralgias, gait problem, joint swelling, myalgias, neck pain and neck stiffness.  Neurological: Negative for dizziness, tingling, weakness, light-headedness, numbness, headaches and paresthesias.    Social History   Tobacco Use  . Smoking status: Current Every Day Smoker    Packs/day: 1.50    Years: 30.00    Pack years: 45.00    Types: Cigarettes  . Smokeless tobacco: Never Used  . Tobacco comment: started smoking at age 34  Substance Use Topics  . Alcohol use: Yes    Comment: Moderate alcohol use      Objective:   BP (!) 155/78 (BP Location: Right Arm, Patient Position: Sitting, Cuff Size: Normal)   Pulse 96   Temp 97.8 F (36.6 C) (Oral)   Wt 178 lb (80.7 kg)   BMI 28.30 kg/m     Physical Exam  General Appearance:    Alert, cooperative, no distress  Eyes:    PERRL, conjunctiva/corneas clear, EOM's intact       Lungs:     Clear to auscultation bilaterally, respirations unlabored  Heart:    Regular rate and rhythm  MS:   FROM back and spine. No vertebral or paravertebral tenderness.   Abdomen:   . No CVA tenderness     Results for orders placed or performed in visit on 11/07/18  POCT urinalysis dipstick  Result Value Ref Range   Color, UA     Clarity, UA     Glucose, UA Negative Negative   Bilirubin, UA Negative    Ketones, UA Negative    Spec Grav, UA <=1.005 (A) 1.010 - 1.025   Blood, UA Negative    pH, UA 6.5 5.0 - 8.0   Protein, UA Negative Negative   Urobilinogen, UA 0.2 0.2 or 1.0 E.U./dL   Nitrite, UA Negative    Leukocytes, UA Negative Negative    Appearance     Odor          Assessment & Plan    1. Acute right-sided low back pain, unspecified whether sciatica present Mostly resolved. Normal u/a Likely musculoskeletal pain. Recommend heat/ice. Call if not continuing to steadily improve.     Lelon Huh, MD  [facility]

## 2018-11-10 ENCOUNTER — Other Ambulatory Visit: Payer: Self-pay | Admitting: Family Medicine

## 2018-11-10 DIAGNOSIS — M5020 Other cervical disc displacement, unspecified cervical region: Secondary | ICD-10-CM

## 2018-11-10 MED ORDER — HYDROCODONE-ACETAMINOPHEN 10-325 MG PO TABS: 1.0000 | ORAL_TABLET | Freq: Four times a day (QID) | ORAL | 0 refills | Status: DC | PRN

## 2018-11-10 NOTE — Telephone Encounter (Signed)
Pharmacy requesting refills. Thanks!  

## 2018-11-10 NOTE — Telephone Encounter (Signed)
Pt needing a refill on:  HYDROcodone-acetaminophen (NORCO) 10-325 MG tablet  Please fill at:  [id number] [address], [place] [phone] (Phone) [phone] (Fax)   Thanks, [place]

## 2018-12-06 ENCOUNTER — Other Ambulatory Visit: Payer: Self-pay

## 2018-12-06 DIAGNOSIS — M5020 Other cervical disc displacement, unspecified cervical region: Secondary | ICD-10-CM

## 2018-12-06 NOTE — Telephone Encounter (Signed)
Patient is requesting a refill on HYDROcodone-acetaminophen (NORCO) 10-325 MG tablet be [facility] to [facility].  

## 2018-12-07 MED ORDER — HYDROCODONE-ACETAMINOPHEN 10-325 MG PO TABS: 1.0000 | ORAL_TABLET | Freq: Four times a day (QID) | ORAL | 0 refills | Status: DC | PRN

## 2019-01-10 ENCOUNTER — Encounter: Payer: Self-pay | Admitting: Family Medicine

## 2019-01-11 ENCOUNTER — Other Ambulatory Visit: Payer: Self-pay | Admitting: *Deleted

## 2019-01-11 DIAGNOSIS — M5020 Other cervical disc displacement, unspecified cervical region: Secondary | ICD-10-CM

## 2019-01-12 ENCOUNTER — Telehealth: Payer: Self-pay

## 2019-01-12 DIAGNOSIS — M5020 Other cervical disc displacement, unspecified cervical region: Secondary | ICD-10-CM

## 2019-01-12 MED ORDER — HYDROCODONE-ACETAMINOPHEN 10-325 MG PO TABS: 1.0000 | ORAL_TABLET | Freq: Four times a day (QID) | ORAL | 0 refills | Status: DC | PRN

## 2019-01-12 NOTE — Addendum Note (Signed)
Addended by: Birdie Sons on: 01/12/2019 11:36 AM   Modules accepted: Orders

## 2019-01-12 NOTE — Telephone Encounter (Signed)
Patient called to request a refill on Hydrocodone.

## 2019-01-16 ENCOUNTER — Encounter: Payer: BLUE CROSS/BLUE SHIELD | Admitting: Family Medicine

## 2019-01-20 ENCOUNTER — Other Ambulatory Visit: Payer: Self-pay | Admitting: Family Medicine

## 2019-02-09 ENCOUNTER — Other Ambulatory Visit: Payer: Self-pay

## 2019-02-09 DIAGNOSIS — M5020 Other cervical disc displacement, unspecified cervical region: Secondary | ICD-10-CM

## 2019-02-09 MED ORDER — HYDROCODONE-ACETAMINOPHEN 10-325 MG PO TABS: 1.0000 | ORAL_TABLET | Freq: Four times a day (QID) | ORAL | 0 refills | Status: DC | PRN

## 2019-02-09 NOTE — Telephone Encounter (Signed)
Patient called requesting refills. Thanks!  

## 2019-02-16 ENCOUNTER — Other Ambulatory Visit: Payer: Self-pay | Admitting: Family Medicine

## 2019-02-16 DIAGNOSIS — F39 Unspecified mood [affective] disorder: Secondary | ICD-10-CM

## 2019-03-12 ENCOUNTER — Other Ambulatory Visit: Payer: Self-pay

## 2019-03-12 DIAGNOSIS — M5020 Other cervical disc displacement, unspecified cervical region: Secondary | ICD-10-CM

## 2019-03-12 MED ORDER — HYDROCODONE-ACETAMINOPHEN 10-325 MG PO TABS: 1.0000 | ORAL_TABLET | Freq: Four times a day (QID) | ORAL | 0 refills | Status: DC | PRN

## 2019-03-30 ENCOUNTER — Ambulatory Visit (INDEPENDENT_AMBULATORY_CARE_PROVIDER_SITE_OTHER): Payer: BC Managed Care – PPO | Admitting: Family Medicine

## 2019-03-30 ENCOUNTER — Other Ambulatory Visit: Payer: Self-pay

## 2019-03-30 ENCOUNTER — Encounter: Payer: Self-pay | Admitting: Family Medicine

## 2019-03-30 VITALS — BP 122/72 | HR 80 | Temp 98.5°F | Resp 16 | Wt 172.0 lb

## 2019-03-30 DIAGNOSIS — Z794 Long term (current) use of insulin: Secondary | ICD-10-CM | POA: Diagnosis not present

## 2019-03-30 DIAGNOSIS — Z23 Encounter for immunization: Secondary | ICD-10-CM

## 2019-03-30 DIAGNOSIS — Z0000 Encounter for general adult medical examination without abnormal findings: Secondary | ICD-10-CM | POA: Diagnosis not present

## 2019-03-30 DIAGNOSIS — I10 Essential (primary) hypertension: Secondary | ICD-10-CM | POA: Diagnosis not present

## 2019-03-30 DIAGNOSIS — F17210 Nicotine dependence, cigarettes, uncomplicated: Secondary | ICD-10-CM

## 2019-03-30 DIAGNOSIS — F39 Unspecified mood [affective] disorder: Secondary | ICD-10-CM

## 2019-03-30 DIAGNOSIS — Z125 Encounter for screening for malignant neoplasm of prostate: Secondary | ICD-10-CM | POA: Diagnosis not present

## 2019-03-30 DIAGNOSIS — R809 Proteinuria, unspecified: Secondary | ICD-10-CM

## 2019-03-30 DIAGNOSIS — Z86010 Personal history of colonic polyps: Secondary | ICD-10-CM

## 2019-03-30 DIAGNOSIS — E119 Type 2 diabetes mellitus without complications: Secondary | ICD-10-CM
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
## 2019-03-30 NOTE — Patient Instructions (Addendum)
.   Please review the attached list of medications and notify my office if there are any errors.   . Please bring all of your medications to every appointment so we can make sure that our medication list is the same as yours.    We will have flu vaccines available after Labor Day. Please go to your pharmacy or call the office in early September to schedule you flu shot.  . Please go to the lab draw station in [address] on the second floor of [facility]  when you are fasting for 8 hours. Normal hours are 8:00am to 12:30pm and 1:30pm to 4:00pm Monday through Friday  . Please stop smoking

## 2019-04-03 DIAGNOSIS — E119 Type 2 diabetes mellitus without complications: Secondary | ICD-10-CM | POA: Diagnosis not present

## 2019-04-03 DIAGNOSIS — Z125 Encounter for screening for malignant neoplasm of prostate: Secondary | ICD-10-CM | POA: Diagnosis not present

## 2019-04-03 DIAGNOSIS — Z794 Long term (current) use of insulin: Secondary | ICD-10-CM | POA: Diagnosis not present

## 2019-04-04 LAB — HEMOGLOBIN A1C
Est. average glucose Bld gHb Est-mCnc: 123 mg/dL
Hgb A1c MFr Bld: 5.9 % — ABNORMAL HIGH (ref 4.8–5.6)

## 2019-04-04 LAB — COMPREHENSIVE METABOLIC PANEL
ALT: 33 IU/L (ref 0–44)
AST: 24 IU/L (ref 0–40)
Albumin/Globulin Ratio: 2.1 (ref 1.2–2.2)
Albumin: 4.8 g/dL (ref 3.8–4.9)
Alkaline Phosphatase: 77 IU/L (ref 39–117)
BUN/Creatinine Ratio: 16 (ref 10–24)
BUN: 15 mg/dL (ref 8–27)
Bilirubin Total: 0.7 mg/dL (ref 0.0–1.2)
CO2: 21 mmol/L (ref 20–29)
Calcium: 9.7 mg/dL (ref 8.6–10.2)
Chloride: 100 mmol/L (ref 96–106)
Creatinine, Ser: 0.92 mg/dL (ref 0.76–1.27)
GFR calc Af Amer: 104 mL/min/{1.73_m2} (ref >59)
GFR calc non Af Amer: 90 mL/min/{1.73_m2} (ref >59)
Globulin, Total: 2.3 g/dL (ref 1.5–4.5)
Glucose: 135 mg/dL — ABNORMAL HIGH (ref 65–99)
Potassium: 4.9 mmol/L (ref 3.5–5.2)
Sodium: 138 mmol/L (ref 134–144)
Total Protein: 7.1 g/dL (ref 6.0–8.5)

## 2019-04-04 LAB — LIPID PANEL
Chol/HDL Ratio: 2.7 ratio (ref 0.0–5.0)
Cholesterol, Total: 141 mg/dL (ref 100–199)
HDL: 53 mg/dL (ref >39)
LDL Calculated: 77 mg/dL (ref 0–99)
Triglycerides: 53 mg/dL (ref 0–149)
VLDL Cholesterol Cal: 11 mg/dL (ref 5–40)

## 2019-04-04 LAB — PSA: Prostate Specific Ag, Serum: 0.6 ng/mL (ref 0.0–4.0)

## 2019-04-05 ENCOUNTER — Telehealth: Payer: Self-pay

## 2019-04-05 DIAGNOSIS — Z1211 Encounter for screening for malignant neoplasm of colon: Secondary | ICD-10-CM

## 2019-04-05 NOTE — Telephone Encounter (Signed)
Pt returned call ° °Teri °

## 2019-04-05 NOTE — Telephone Encounter (Signed)
-----   Message from Birdie Sons, MD sent at 04/04/2019  8:20 AM EDT ----- Also, I he is due for colonoscopy since he had adenoma removed in 2014. I can't remember if we talked about this at his office visit. Dr. Tiffany Kocher has retired so i'm going to send referral to another GI.

## 2019-04-05 NOTE — Telephone Encounter (Signed)
Tried calling patient. Left message to call back. 

## 2019-04-05 NOTE — Telephone Encounter (Signed)
Below is the original message that patient needs to be advised of. Some of it was left out in the start of this telephone encounter.     Notes recorded by Birdie Sons, MD on 04/04/2019 at 8:20 AM EDT  Also, I he is due for colonoscopy since he had adenoma removed in 2014. I can't remember if we talked about this at his office visit. Dr. Tiffany Kocher has retired so i'm going to send referral to another GI.  ------   Notes recorded by Birdie Sons, MD on 04/04/2019 at 8:06 AM EDT  a1c is stable at 5.9. cholesterol is well controlled, rest of labs are good. Continue current medications. Check yearly.

## 2019-04-05 NOTE — Telephone Encounter (Signed)
Pt advised.  He agreed to the referral to GI.  Thanks,    -Mickel Baas

## 2019-04-10 ENCOUNTER — Other Ambulatory Visit: Payer: Self-pay | Admitting: Family Medicine

## 2019-04-10 DIAGNOSIS — M5020 Other cervical disc displacement, unspecified cervical region: Secondary | ICD-10-CM

## 2019-04-10 MED ORDER — HYDROCODONE-ACETAMINOPHEN 10-325 MG PO TABS: 1.0000 | ORAL_TABLET | Freq: Four times a day (QID) | ORAL | 0 refills | Status: DC | PRN

## 2019-04-10 NOTE — Telephone Encounter (Signed)
Pt needs a refill on his  Hydrocodone 10-325  [facility]  [employer]

## 2019-04-17 ENCOUNTER — Other Ambulatory Visit: Payer: Self-pay

## 2019-04-17 ENCOUNTER — Telehealth: Payer: Self-pay

## 2019-04-17 DIAGNOSIS — Z1211 Encounter for screening for malignant neoplasm of colon: Secondary | ICD-10-CM

## 2019-04-17 NOTE — Telephone Encounter (Signed)
Gastroenterology Pre-Procedure Review  Request Date: 05/25/19 Requesting Physician: Dr. Allen Norris  PATIENT REVIEW QUESTIONS: The patient responded to the following health history questions as indicated:    1. Are you having any GI issues? no 2. Do you have a personal history of Polyps? no 3. Do you have a family history of Colon Cancer or Polyps? no 4. Diabetes Mellitus? no 5. Joint replacements in the past 12 months?no 6. Major health problems in the past 3 months?no 7. Any artificial heart valves, MVP, or defibrillator?no    MEDICATIONS & ALLERGIES:    Patient reports the following regarding taking any anticoagulation/antiplatelet therapy:   Plavix, Coumadin, Eliquis, Xarelto, Lovenox, Pradaxa, Brilinta, or Effient? no Aspirin? yes (81 mg daily)  Patient confirms/reports the following medications:  Current Outpatient Medications  Medication Sig Dispense Refill  . aspirin 81 MG tablet Take 1 tablet by mouth daily.    . B COMPLEX VITAMINS PO Take 1 capsule by mouth daily.    . cyanocobalamin 100 MCG tablet Take 1 tablet by mouth daily.    . diazepam (VALIUM) 5 MG tablet TAKE 2 TABLETS BY MOUTH ONCE DAILY IF NEEDED 50 tablet 3  . glipiZIDE (GLUCOTROL XL) 2.5 MG 24 hr tablet TAKE 1 TABLET BY MOUTH DAILY 90 tablet 4  . glucose blood test strip 1 strip by Does not apply route daily.    Marland Kitchen HYDROcodone-acetaminophen (NORCO) 10-325 MG tablet Take 1 tablet by mouth every 6 (six) hours as needed. Reviewed controlled substance use record in PMP Alert. 120 tablet 0  . lisinopril-hydrochlorothiazide (ZESTORETIC) 10-12.5 MG tablet TAKE 1 TABLET BY MOUTH DAILY 90 tablet 3   No current facility-administered medications for this visit.     Patient confirms/reports the following allergies:  No Known Allergies  No orders of the defined types were placed in this encounter.   AUTHORIZATION INFORMATION Primary Insurance: 1D#: Group #:  Secondary Insurance: 1D#: Group #:  SCHEDULE  INFORMATION: Date: 05/25/19 Time: Location:[facility]

## 2019-05-10 ENCOUNTER — Other Ambulatory Visit: Payer: Self-pay | Admitting: Family Medicine

## 2019-05-10 DIAGNOSIS — M5020 Other cervical disc displacement, unspecified cervical region: Secondary | ICD-10-CM

## 2019-05-10 NOTE — Telephone Encounter (Signed)
Pt requesting HYDROcodone-acetaminophen (NORCO) 10-325 MG tablet be sent to [facility]

## 2019-05-11 MED ORDER — HYDROCODONE-ACETAMINOPHEN 10-325 MG PO TABS: 1.0000 | ORAL_TABLET | Freq: Four times a day (QID) | ORAL | 0 refills | Status: DC | PRN

## 2019-05-18 NOTE — Anesthesia Preprocedure Evaluation (Addendum)
Anesthesia Evaluation  Patient identified by MRN, date of birth, ID band Patient awake    Reviewed: Allergy & Precautions, NPO status , Patient's Chart, lab work & pertinent test results  History of Anesthesia Complications Negative for: history of anesthetic complications  Airway Mallampati: II  TM Distance: >3 FB Neck ROM: Full    Dental  (+) Upper Dentures, Implants   Pulmonary Current Smoker,    breath sounds clear to auscultation       Cardiovascular hypertension, (-) angina(-) DOE  Rhythm:Regular Rate:Normal     Neuro/Psych PSYCHIATRIC DISORDERS  Neuromuscular disease (Sciatica, cervical radiculopathy)    GI/Hepatic neg GERD  ,  Endo/Other  diabetes  Renal/GU      Musculoskeletal   Abdominal   Peds  Hematology   Anesthesia Other Findings   Reproductive/Obstetrics                            Anesthesia Physical Anesthesia Plan  ASA: II  Anesthesia Plan: General   Post-op Pain Management:    Induction: Intravenous  PONV Risk Score and Plan: 1 and Propofol infusion and TIVA  Airway Management Planned: Natural Airway and Nasal Cannula  Additional Equipment:   Intra-op Plan:   Post-operative Plan:   Informed Consent: I have reviewed the patients History and Physical, chart, labs and discussed the procedure including the risks, benefits and alternatives for the proposed anesthesia with the patient or authorized representative who has indicated his/her understanding and acceptance.       Plan Discussed with: CRNA and Anesthesiologist  Anesthesia Plan Comments:         Anesthesia Quick Evaluation   Active Ambulatory Problems    Diagnosis Date Noted  . Allergic rhinitis 01/02/2015  . Arthropathia 12/16/2008  . Herniated cervical disc 05/29/2011  . Diabetes mellitus, type 2 ([place]) 01/03/2009  . Mood disorder ([place]) 09/14/2003  . Failure of erection 01/02/2015  .  Essential (primary) hypertension 09/13/1998  . External hemorrhoids without complication [id number]  . Insomnia 01/02/2015  . Microalbuminuria 01/02/2015  . Sciatica 10/27/2009  . Smoking greater than 30 pack years 09/13/1978  . Cervical radiculopathy 01/03/2015  . History of adenomatous polyp of colon 01/03/2015  . Umbilical hernia [id number]   Resolved Ambulatory Problems    Diagnosis Date Noted  . Local infection of wound 01/02/2015   Past Medical History:  Diagnosis Date  . Dysuria   . History of chicken pox   . History of MRI of cervical spine 05/29/2011    CBC    Component Value Date/Time   WBC 7.2 01/16/2018 0807   RBC 5.05 01/16/2018 0807   HGB 16.7 01/16/2018 0807   HCT 48.2 01/16/2018 0807   PLT 293 01/16/2018 0807   MCV 95 01/16/2018 0807   MCH 33.1 (H) 01/16/2018 0807   MCHC 34.6 01/16/2018 0807   RDW 12.7 01/16/2018 0807    CMP     Component Value Date/Time   NA 138 04/03/2019 0810   K 4.9 04/03/2019 0810   CL 100 04/03/2019 0810   CO2 21 04/03/2019 0810   GLUCOSE 135 (H) 04/03/2019 0810   BUN 15 04/03/2019 0810   CREATININE 0.92 04/03/2019 0810   CALCIUM 9.7 04/03/2019 0810   PROT 7.1 04/03/2019 0810   ALBUMIN 4.8 04/03/2019 0810   AST 24 04/03/2019 0810   ALT 33 04/03/2019 0810   ALKPHOS 77 04/03/2019 0810   BILITOT 0.7 04/03/2019 0810   GFRNONAA 90 04/03/2019  [place] 04/03/2019 0810    COAGS No results found for: INR, PTT  I have seen and consented the patient, HOBBES NAULT. I have answered all of his questions regarding anesthesia. he is appropriately NPO.   Josephina Shih, MD Anesthesia

## 2019-05-22 ENCOUNTER — Other Ambulatory Visit
Admission: RE | Admit: 2019-05-22 | Discharge: 2019-05-22 | Disposition: A | Discharge status: Home / Self Care | Payer: BLUE CROSS/BLUE SHIELD | Source: Ambulatory Visit | Attending: Gastroenterology | Admitting: Gastroenterology

## 2019-05-22 ENCOUNTER — Encounter: Payer: Self-pay | Admitting: *Deleted

## 2019-05-22 ENCOUNTER — Other Ambulatory Visit: Payer: Self-pay

## 2019-05-22 DIAGNOSIS — Z01812 Encounter for preprocedural laboratory examination: Secondary | ICD-10-CM | POA: Diagnosis not present

## 2019-05-22 DIAGNOSIS — Z20828 Contact with and (suspected) exposure to other viral communicable diseases: Secondary | ICD-10-CM | POA: Insufficient documentation

## 2019-05-22 LAB — SARS CORONAVIRUS 2 (TAT 6-24 HRS): SARS Coronavirus 2: NEGATIVE

## 2019-05-23 NOTE — Discharge Instructions (Signed)

## 2019-05-25 ENCOUNTER — Ambulatory Visit: Payer: BLUE CROSS/BLUE SHIELD | Admitting: Anesthesiology

## 2019-05-25 ENCOUNTER — Encounter
Admission: RE | Disposition: A | Discharge status: Home / Self Care | Payer: Self-pay | Source: Home / Self Care | Attending: Gastroenterology

## 2019-05-25 ENCOUNTER — Ambulatory Visit
Admission: RE | Admit: 2019-05-25 | Discharge: 2019-05-25 | Disposition: A | Discharge status: Home / Self Care | Payer: BLUE CROSS/BLUE SHIELD | Attending: Gastroenterology | Admitting: Gastroenterology

## 2019-05-25 ENCOUNTER — Other Ambulatory Visit: Payer: Self-pay

## 2019-05-25 DIAGNOSIS — Z79899 Other long term (current) drug therapy: Secondary | ICD-10-CM | POA: Diagnosis not present

## 2019-05-25 DIAGNOSIS — K641 Second degree hemorrhoids: Secondary | ICD-10-CM | POA: Insufficient documentation

## 2019-05-25 DIAGNOSIS — D124 Benign neoplasm of descending colon: Secondary | ICD-10-CM | POA: Diagnosis not present

## 2019-05-25 DIAGNOSIS — Z1211 Encounter for screening for malignant neoplasm of colon: Secondary | ICD-10-CM | POA: Diagnosis not present

## 2019-05-25 DIAGNOSIS — K635 Polyp of colon: Secondary | ICD-10-CM

## 2019-05-25 DIAGNOSIS — Z860100 Personal history of colon polyps, unspecified: Secondary | ICD-10-CM

## 2019-05-25 DIAGNOSIS — F17210 Nicotine dependence, cigarettes, uncomplicated: Secondary | ICD-10-CM | POA: Diagnosis not present

## 2019-05-25 DIAGNOSIS — Z7984 Long term (current) use of oral hypoglycemic drugs: Secondary | ICD-10-CM | POA: Diagnosis not present

## 2019-05-25 DIAGNOSIS — D126 Benign neoplasm of colon, unspecified: Secondary | ICD-10-CM | POA: Diagnosis not present

## 2019-05-25 DIAGNOSIS — I10 Essential (primary) hypertension: Secondary | ICD-10-CM | POA: Insufficient documentation

## 2019-05-25 DIAGNOSIS — Z7982 Long term (current) use of aspirin: Secondary | ICD-10-CM | POA: Insufficient documentation

## 2019-05-25 DIAGNOSIS — E119 Type 2 diabetes mellitus without complications: Secondary | ICD-10-CM | POA: Insufficient documentation

## 2019-05-25 HISTORY — DX: Presence of dental prosthetic device (complete) (partial): Z97.2

## 2019-05-25 HISTORY — PX: POLYPECTOMY: SHX5525

## 2019-05-25 HISTORY — PX: COLONOSCOPY WITH PROPOFOL: SHX5780

## 2019-05-25 HISTORY — DX: Essential (primary) hypertension: I10

## 2019-05-25 LAB — GLUCOSE, CAPILLARY: Glucose-Capillary: 127 mg/dL — ABNORMAL HIGH (ref 70–99)

## 2019-05-25 SURGERY — COLONOSCOPY WITH PROPOFOL
Anesthesia: General | Site: Rectum

## 2019-05-25 MED ORDER — ACETAMINOPHEN 10 MG/ML IV SOLN: 1000.0000 mg | Freq: Once | INTRAVENOUS | Status: DC | PRN

## 2019-05-25 MED ORDER — PROPOFOL 10 MG/ML IV BOLUS
INTRAVENOUS | Status: DC | PRN
  Administered 2019-05-25: 20 mg via INTRAVENOUS
  Administered 2019-05-25: 30 mg via INTRAVENOUS
  Administered 2019-05-25 (×2): 100 mg via INTRAVENOUS
  Administered 2019-05-25: 50 mg via INTRAVENOUS

## 2019-05-25 MED ORDER — LIDOCAINE HCL (CARDIAC) PF 100 MG/5ML IV SOSY
PREFILLED_SYRINGE | INTRAVENOUS | Status: DC | PRN
  Administered 2019-05-25: 30 mg via INTRAVENOUS

## 2019-05-25 MED ORDER — LACTATED RINGERS IV SOLN
100.0000 mL/h | INTRAVENOUS | Status: DC
  Administered 2019-05-25: 08:00:00 via INTRAVENOUS

## 2019-05-25 MED ORDER — ONDANSETRON HCL 4 MG/2ML IJ SOLN: 4.0000 mg | Freq: Once | INTRAMUSCULAR | Status: DC | PRN

## 2019-05-25 MED ORDER — STERILE WATER FOR IRRIGATION IR SOLN
Status: DC | PRN
  Administered 2019-05-25: .05 mL

## 2019-05-25 SURGICAL SUPPLY — 7 items
CANISTER SUCT 1200ML W/VALVE (MISCELLANEOUS) ×3 IMPLANT
GOWN CVR UNV OPN BCK APRN NK (MISCELLANEOUS) ×4 IMPLANT
GOWN ISOL THUMB LOOP REG UNIV (MISCELLANEOUS) ×2
KIT ENDO PROCEDURE OLY (KITS) ×3 IMPLANT
SNARE SHORT THROW 13M SML OVAL (MISCELLANEOUS) ×3 IMPLANT
TRAP ETRAP POLY (MISCELLANEOUS) ×3 IMPLANT
WATER STERILE IRR 250ML POUR (IV SOLUTION) ×3 IMPLANT

## 2019-05-25 NOTE — Anesthesia Postprocedure Evaluation (Signed)
Anesthesia Post Note  Patient: Antonio Howe  Procedure(s) Performed: COLONOSCOPY WITH PROPOFOL (N/A Rectum) POLYPECTOMY (Rectum)  Patient location during evaluation: PACU Anesthesia Type: General Level of consciousness: awake and alert Pain management: pain level controlled Vital Signs Assessment: post-procedure vital signs reviewed and stable Respiratory status: spontaneous breathing, nonlabored ventilation, respiratory function stable and patient connected to nasal cannula oxygen Cardiovascular status: blood pressure returned to baseline and stable Postop Assessment: no apparent nausea or vomiting Anesthetic complications: no    Bricen Victory A  Pavle Wiler

## 2019-05-25 NOTE — Op Note (Signed)
[facility] Gastroenterology Patient Name: Antonio Howe Procedure Date: 05/25/2019 9:17 AM MRN: [id number] Account #: [id number] Date of Birth: 1960-12-02 Admit Type: Outpatient Age: 58 Room: [facility] OR ROOM 01 Gender: Male Note Status: Finalized Procedure:            Colonoscopy Indications:          High risk colon cancer surveillance: Personal history                        of colonic polyps Providers:            Lucilla Lame MD, MD Referring MD:         Kirstie Peri. Caryn Section, MD (Referring MD) Medicines:            Propofol per Anesthesia Complications:        No immediate complications. Procedure:            Pre-Anesthesia Assessment:                       - Prior to the procedure, a History and Physical was                        performed, and patient medications and allergies were                        reviewed. The patient's tolerance of previous                        anesthesia was also reviewed. The risks and benefits of                        the procedure and the sedation options and risks were                        discussed with the patient. All questions were                        answered, and informed consent was obtained. Prior                        Anticoagulants: The patient has taken no previous                        anticoagulant or antiplatelet agents. ASA Grade                        Assessment: II - A patient with mild systemic disease.                        After reviewing the risks and benefits, the patient was                        deemed in satisfactory condition to undergo the                        procedure.                       After obtaining informed consent, the colonoscope was  passed under direct vision. Throughout the procedure,                        the patient's blood pressure, pulse, and oxygen                        saturations were monitored continuously. The was   introduced through the anus and advanced to the the                        cecum, identified by appendiceal orifice and ileocecal                        valve. The colonoscopy was performed without                        difficulty. The patient tolerated the procedure well.                        The quality of the bowel preparation was excellent. Findings:      The perianal and digital rectal examinations were normal.      A 6 mm polyp was found in the descending colon. The polyp was sessile.       The polyp was removed with a cold snare. Resection and retrieval were       complete.      Non-bleeding internal hemorrhoids were found during retroflexion. The       hemorrhoids were Grade II (internal hemorrhoids that prolapse but reduce       spontaneously). Impression:           - One 6 mm polyp in the descending colon, removed with                        a cold snare. Resected and retrieved.                       - Non-bleeding internal hemorrhoids. Recommendation:       - Discharge patient to home.                       - Resume previous diet.                       - Continue present medications.                       - Await pathology results.                       - Repeat colonoscopy in 5 years for surveillance. Procedure Code(s):    --- Professional ---                       [phone], Colonoscopy, flexible; with removal of tumor(s),                        polyp(s), or other lesion(s) by snare technique Diagnosis Code(s):    --- Professional ---                       Z86.010, Personal history of colonic polyps  K63.5, Polyp of colon CPT copyright 2019 American Medical Association. All rights reserved. The codes documented in this report are preliminary and upon coder review may  be revised to meet current compliance requirements. Lucilla Lame MD, MD 05/25/2019 9:36:45 AM This report has been signed electronically. Number of Addenda: 0 Note Initiated On: 05/25/2019  9:17 AM Scope Withdrawal Time: 0 hours 6 minutes 39 seconds  Total Procedure Duration: 0 hours 10 minutes 3 seconds  Estimated Blood Loss: Estimated blood loss: none.      [facility]

## 2019-05-25 NOTE — Transfer of Care (Signed)
Immediate Anesthesia Transfer of Care Note  Patient: Antonio Howe  Procedure(s) Performed: COLONOSCOPY WITH PROPOFOL (N/A Rectum) POLYPECTOMY (Rectum)  Patient Location: PACU  Anesthesia Type: General  Level of Consciousness: awake, alert  and patient cooperative  Airway and Oxygen Therapy: Patient Spontanous Breathing and Patient connected to supplemental oxygen  Post-op Assessment: Post-op Vital signs reviewed, Patient's Cardiovascular Status Stable, Respiratory Function Stable, Patent Airway and No signs of Nausea or vomiting  Post-op Vital Signs: Reviewed and stable  Complications: No apparent anesthesia complications

## 2019-05-25 NOTE — Anesthesia Procedure Notes (Signed)
Procedure Name: MAC Date/Time: 05/25/2019 9:22 AM Performed by: Georga Bora, CRNA Pre-anesthesia Checklist: Patient identified, Suction available, Emergency Drugs available, Patient being monitored and Timeout performed Patient Re-evaluated:Patient Re-evaluated prior to induction
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
## 2019-05-28 ENCOUNTER — Encounter: Payer: Self-pay | Admitting: Gastroenterology

## 2019-05-29 ENCOUNTER — Encounter: Payer: Self-pay | Admitting: Gastroenterology

## 2019-06-01 ENCOUNTER — Ambulatory Visit: Payer: Self-pay | Admitting: Family Medicine

## 2019-06-05 ENCOUNTER — Ambulatory Visit (INDEPENDENT_AMBULATORY_CARE_PROVIDER_SITE_OTHER): Payer: BC Managed Care – PPO | Admitting: Family Medicine

## 2019-06-05 ENCOUNTER — Other Ambulatory Visit: Payer: Self-pay

## 2019-06-05 DIAGNOSIS — Z23 Encounter for immunization: Secondary | ICD-10-CM

## 2019-06-12 ENCOUNTER — Other Ambulatory Visit: Payer: Self-pay | Admitting: *Deleted

## 2019-06-12 DIAGNOSIS — M5020 Other cervical disc displacement, unspecified cervical region: Secondary | ICD-10-CM

## 2019-06-12 MED ORDER — HYDROCODONE-ACETAMINOPHEN 10-325 MG PO TABS: 1.0000 | ORAL_TABLET | Freq: Four times a day (QID) | ORAL | 0 refills | Status: DC | PRN

## 2019-06-18 NOTE — Progress Notes (Signed)
Vaccine only, no provider visit.

## 2019-06-18 NOTE — Patient Instructions (Signed)
.   Please review the attached list of medications and notify my office if there are any errors.   . Please bring all of your medications to every appointment so we can make sure that our medication list is the same as yours.   . It is especially important to get the annual flu vaccine this year. If you haven't had it already, please go to your pharmacy or call the office as soon as possible to schedule you flu shot.  

## 2019-06-23 ENCOUNTER — Other Ambulatory Visit: Payer: Self-pay | Admitting: Family Medicine

## 2019-06-23 DIAGNOSIS — F39 Unspecified mood [affective] disorder: Secondary | ICD-10-CM

## 2019-07-10 ENCOUNTER — Other Ambulatory Visit: Payer: Self-pay | Admitting: Family Medicine

## 2019-07-10 DIAGNOSIS — M5020 Other cervical disc displacement, unspecified cervical region: Secondary | ICD-10-CM

## 2019-07-10 MED ORDER — HYDROCODONE-ACETAMINOPHEN 10-325 MG PO TABS: 1.0000 | ORAL_TABLET | Freq: Four times a day (QID) | ORAL | 0 refills | Status: DC | PRN

## 2019-07-10 NOTE — Telephone Encounter (Signed)
Last OV 06/05/2019 and last RF 06/12/2019

## 2019-07-10 NOTE — Telephone Encounter (Signed)
Pt needing a refill on: ° °HYDROcodone-acetaminophen (NORCO) 10-325 MG tablet ° °Please fill at: °[id number] - [place], [place] - [address] [phone] (Phone) °[phone] (Fax)  ° °Thanks, °[facility] ° °

## 2019-08-08 ENCOUNTER — Other Ambulatory Visit: Payer: Self-pay | Admitting: Family Medicine

## 2019-08-08 DIAGNOSIS — M5020 Other cervical disc displacement, unspecified cervical region: Secondary | ICD-10-CM

## 2019-08-08 NOTE — Telephone Encounter (Signed)
Medication Refill - Medication: HYDROcodone-acetaminophen (NORCO) 10-325 MG tablet   Has the patient contacted their pharmacy? No. (Agent: If no, request that the patient contact the pharmacy for the refill.) (Agent: If yes, when and what did the pharmacy advise?)  Preferred Pharmacy (with phone number or street name):  [id number] - [place], [address] [phone] (Phone) [phone] (Fax)    Agent: Please be advised that RX refills may take up to 3 business days. We ask that you follow-up with your pharmacy.

## 2019-08-09 MED ORDER — HYDROCODONE-ACETAMINOPHEN 10-325 MG PO TABS: 1.0000 | ORAL_TABLET | Freq: Four times a day (QID) | ORAL | 0 refills | Status: DC | PRN

## 2019-09-10 ENCOUNTER — Other Ambulatory Visit: Payer: Self-pay | Admitting: Family Medicine

## 2019-09-10 DIAGNOSIS — M5020 Other cervical disc displacement, unspecified cervical region: Secondary | ICD-10-CM

## 2019-09-10 MED ORDER — HYDROCODONE-ACETAMINOPHEN 10-325 MG PO TABS: 1.0000 | ORAL_TABLET | Freq: Four times a day (QID) | ORAL | 0 refills | Status: DC | PRN

## 2019-09-10 NOTE — Telephone Encounter (Signed)
Medication Refill - Medication: HYDROcodone-acetaminophen (NORCO) 10-325 MG tablet  Has the patient contacted their pharmacy? No  (Agent: If no, request that the patient contact the pharmacy for the refill.) (Agent: If yes, when and what did the pharmacy advise?)  Preferred Pharmacy (with phone number or street name):  [id number] - [place], [place] [address] Phone:  [phone]  Fax:  [phone]     Agent: Please be advised that RX refills may take up to 3 business days. We ask that you follow-up with your pharmacy.

## 2019-10-09 ENCOUNTER — Other Ambulatory Visit: Payer: Self-pay | Admitting: Family Medicine

## 2019-10-09 DIAGNOSIS — M5020 Other cervical disc displacement, unspecified cervical region: Secondary | ICD-10-CM

## 2019-10-09 MED ORDER — HYDROCODONE-ACETAMINOPHEN 10-325 MG PO TABS: 1.0000 | ORAL_TABLET | Freq: Four times a day (QID) | ORAL | 0 refills | Status: DC | PRN

## 2019-10-09 NOTE — Telephone Encounter (Signed)
Medication Refill - Medication: HYDROcodone-acetaminophen (NORCO) 10-325 MG tablet    Has the patient contacted their pharmacy? No. (Agent: If no, request that the patient contact the pharmacy for the refill.) (Agent: If yes, when and what did the pharmacy advise?)  Preferred Pharmacy (with phone number or street name):  [id number] - [place], [place] [address] Phone:  [phone]  Fax:  [phone]       Agent: Please be advised that RX refills may take up to 3 business days. We ask that you follow-up with your pharmacy.

## 2019-10-09 NOTE — Telephone Encounter (Signed)
Requested medication (s) are due for refill today: yes  Requested medication (s) are on the active medication list: yes  Last refill:  09/10/2019  Future visit scheduled: no  Notes to clinic: not delegated    Requested Prescriptions  Pending Prescriptions Disp Refills   HYDROcodone-acetaminophen (NORCO) 10-325 MG tablet 120 tablet 0    Sig: Take 1 tablet by mouth every 6 (six) hours as needed. Reviewed controlled substance use record in PMP Alert.      Not Delegated - Analgesics:  Opioid Agonist Combinations Failed - 10/09/2019  9:59 AM      Failed - This refill cannot be delegated      Failed - Urine Drug Screen completed in last 360 days.      Failed - Valid encounter within last 6 months    Recent Outpatient Visits           4 months ago Need for influenza vaccination   [facility] Birdie Sons, MD   6 months ago Annual physical exam   [facility] Birdie Sons, MD   11 months ago Acute right-sided low back pain, unspecified whether sciatica present   [facility] Birdie Sons, MD   1 year ago Type 2 diabetes mellitus without complication, with long-term current use of insulin [facility])   [facility] Birdie Sons, MD   1 year ago Type 2 diabetes mellitus without complication, with long-term current use of insulin [facility])   [facility] Birdie Sons, MD

## 2019-10-10 ENCOUNTER — Telehealth: Payer: Self-pay

## 2019-10-10 NOTE — Telephone Encounter (Signed)
Copied from [id number] [phone]. Topic: General - Other >> Oct 10, 2019  9:31 AM Leward Quan A wrote: Reason for CRM: Patient called to say that his Rx for HYDROcodone-acetaminophen (NORCO) 10-325 MG tablet  per pharmacy require Prior authorization from the insurance. Asking for the doctor to please contact the insurance company and take care of this please. Please call patient with questions Ph# [phone]

## 2019-10-11 NOTE — Telephone Encounter (Signed)
Pt called back saying he is going to be out of this medication on Sunday.  CB#  [phone]

## 2019-10-12 NOTE — Telephone Encounter (Addendum)
PA has been started. PA could not be completed on cover my meds.

## 2019-10-12 NOTE — Telephone Encounter (Addendum)
Insurance is faxing form to be completed to office for PA.

## 2019-10-15 NOTE — Telephone Encounter (Signed)
PA form was not received so I called patient's insurance and was advised that we could complete PA on line at [email or web address]  I completed PA online. Awaiting response/ decision from patients insurance.  Prior auth EOC ID: [id number].  Patient's member ID #: [id number]

## 2019-10-15 NOTE — Telephone Encounter (Signed)
Patient called back in to check on status of PA. Please advise.

## 2019-10-15 NOTE — Telephone Encounter (Signed)
Waiting on fax

## 2019-10-17 NOTE — Telephone Encounter (Signed)
Pt called in to check on status of the PA  Best number  [phone]

## 2019-10-17 NOTE — Telephone Encounter (Signed)
I checked the status  online and it still says: "IN PROGRESS"

## 2019-10-22 NOTE — Telephone Encounter (Signed)
Prior Authorization approved. Pharmacy and patient notified.

## 2019-10-29 ENCOUNTER — Other Ambulatory Visit: Payer: Self-pay

## 2019-10-29 ENCOUNTER — Encounter: Payer: Self-pay | Admitting: Family Medicine

## 2019-10-29 ENCOUNTER — Ambulatory Visit: Payer: BC Managed Care – PPO | Admitting: Family Medicine

## 2019-10-29 VITALS — BP 110/78 | HR 59 | Temp 97.1°F | Resp 16 | Wt 185.0 lb

## 2019-10-29 DIAGNOSIS — Z794 Long term (current) use of insulin: Secondary | ICD-10-CM | POA: Diagnosis not present

## 2019-10-29 DIAGNOSIS — F39 Unspecified mood [affective] disorder: Secondary | ICD-10-CM

## 2019-10-29 DIAGNOSIS — I10 Essential (primary) hypertension: Secondary | ICD-10-CM

## 2019-10-29 DIAGNOSIS — E119 Type 2 diabetes mellitus without complications: Secondary | ICD-10-CM | POA: Diagnosis not present

## 2019-10-29 DIAGNOSIS — F17210 Nicotine dependence, cigarettes, uncomplicated: Secondary | ICD-10-CM | POA: Diagnosis not present

## 2019-10-29 LAB — POCT GLYCOSYLATED HEMOGLOBIN (HGB A1C)
Est. average glucose Bld gHb Est-mCnc: 123
Hemoglobin A1C: 5.9 % — AB (ref 4.0–5.6)

## 2019-10-29 NOTE — Patient Instructions (Addendum)
.   Please review the attached list of medications and notify my office if there are any errors.   . Please bring all of your medications to every appointment so we can make sure that our medication list is the same as yours.    Check the [place] web site and the [facility] Department web site at least every week for updates on availability of the Covid Vaccine

## 2019-10-29 NOTE — Progress Notes (Signed)
Patient: Antonio Howe Male    DOB: [birth year]   59 y.o.   MRN: [id number] Visit Date: 10/29/2019  Today's Provider: Lelon Huh, MD   Chief Complaint  Patient presents with  . Diabetes  . Hypertension   Subjective:     HPI  Hypertension, follow-up:  BP Readings from Last 3 Encounters:  10/29/19 110/78  05/25/19 (!) 163/96  03/30/19 122/72    He was last seen for hypertension 7 months ago.  BP at that visit was 122/72. Management since that visit includes no changes. He reports good compliance with treatment. He is not having side effects.  He is not exercising. He is adherent to low salt diet.   Outside blood pressures are not being checked. He is experiencing none.  Patient denies chest pain, chest pressure/discomfort, claudication, dyspnea, exertional chest pressure/discomfort, fatigue, irregular heart beat, lower extremity edema, near-syncope, orthopnea, palpitations, paroxysmal nocturnal dyspnea, syncope and tachypnea.   Cardiovascular risk factors include advanced age (older than 55 for men, 59 for women), diabetes mellitus, hypertension and male gender.  Use of agents associated with hypertension: NSAIDS.     Weight trend: stable Wt Readings from Last 3 Encounters:  10/29/19 185 lb (83.9 kg)  05/25/19 174 lb 2.6 oz (79 kg)  03/30/19 172 lb (78 kg)    Current diet: well balanced  ------------------------------------------------------------------------  Diabetes Mellitus Type II, Follow-up:   Lab Results  Component Value Date   HGBA1C 5.9 (H) 04/03/2019   HGBA1C 5.7 (A) 07/11/2018   HGBA1C 6.1 01/09/2018    Last seen for diabetes 7 months ago.  Management since then includes no changes. He reports good compliance with treatment. He is not having side effects.  Current symptoms include none and have been stable. Home blood sugar records: blood sugars are not checked  Episodes of hypoglycemia? no   Current insulin regiment: Is not on  insulin Most Recent Eye Exam: not UTD Weight trend: stable Prior visit with dietician: No Current exercise: none Current diet habits: well balanced  Pertinent Labs:    Component Value Date/Time   CHOL 141 04/03/2019 0810   TRIG 53 04/03/2019 0810   HDL 53 04/03/2019 0810   LDLCALC 77 04/03/2019 0810   CREATININE 0.92 04/03/2019 0810    Wt Readings from Last 3 Encounters:  10/29/19 185 lb (83.9 kg)  05/25/19 174 lb 2.6 oz (79 kg)  03/30/19 172 lb (78 kg)    ------------------------------------------------------------------------  Follow up for Mood disorder:  The patient was last seen for this 7 months ago. Changes made at last visit include no changes; patient was doing well with diazepam.  He reports good compliance with treatment. He feels that condition is Unchanged. He is not having side effects.   ------------------------------------------------------------------------------------  No Known Allergies   Current Outpatient Medications:  .  aspirin 81 MG tablet, Take 1 tablet by mouth daily., Disp: , Rfl:  .  B COMPLEX VITAMINS PO, Take 1 capsule by mouth daily., Disp: , Rfl:  .  cyanocobalamin 100 MCG tablet, Take 1 tablet by mouth daily., Disp: , Rfl:  .  diazepam (VALIUM) 5 MG tablet, TAKE 2 TABLETS BY MOUTH ONCE DAILY IF NEEDED, Disp: 50 tablet, Rfl: 5 .  glipiZIDE (GLUCOTROL XL) 2.5 MG 24 hr tablet, TAKE 1 TABLET BY MOUTH DAILY, Disp: 90 tablet, Rfl: 4 .  glucose blood test strip, 1 strip by Does not apply route daily., Disp: , Rfl:  .  HYDROcodone-acetaminophen (NORCO) 10-325  MG tablet, Take 1 tablet by mouth every 6 (six) hours as needed. Reviewed controlled substance use record in PMP Alert., Disp: 120 tablet, Rfl: 0 .  lisinopril-hydrochlorothiazide (ZESTORETIC) 10-12.5 MG tablet, TAKE 1 TABLET BY MOUTH DAILY, Disp: 90 tablet, Rfl: 3  Review of Systems  Constitutional: Negative for appetite change, chills and fever.  Respiratory: Negative for chest  tightness, shortness of breath and wheezing.   Cardiovascular: Negative for chest pain and palpitations.  Gastrointestinal: Negative for abdominal pain, nausea and vomiting.    Social History   Tobacco Use  . Smoking status: Current Every Day Smoker    Packs/day: 1.50 (has now cut back to 1/2 ppd)    Years: 30.00    Pack years: 45.00    Types: Cigarettes  . Smokeless tobacco: Never Used  . Tobacco comment: started smoking at age 37  Substance Use Topics  . Alcohol use: Yes    Alcohol/week: 35.0 standard drinks    Types: 35 Cans of beer per week    Comment:        Objective:   BP 110/78 (BP Location: Left Arm, Patient Position: Sitting, Cuff Size: Normal)   Pulse (!) 59   Temp (!) 97.1 F (36.2 C) (Temporal)   Resp 16   Wt 185 lb (83.9 kg)   SpO2 98% Comment: room air  BMI 28.98 kg/m  Vitals:   10/29/19 1019  BP: 110/78  Pulse: (!) 59  Resp: 16  Temp: (!) 97.1 F (36.2 C)  TempSrc: Temporal  SpO2: 98%  Weight: 185 lb (83.9 kg)  Body mass index is 28.98 kg/m.   Physical Exam   General appearance:  Overweight male, cooperative and in no acute distress Head: Normocephalic, without obvious abnormality, atraumatic Respiratory: Respirations even and unlabored, normal respiratory rate Extremities: All extremities are intact.  Skin: Skin color, texture, turgor normal. No rashes seen  Psych: Appropriate mood and affect. Neurologic: Mental status: Alert, oriented to person, place, and time, thought content appropriate.   Results for orders placed or performed in visit on 10/29/19  POCT HgB A1C  Result Value Ref Range   Hemoglobin A1C 5.9 (A) 4.0 - 5.6 %   Est. average glucose Bld gHb Est-mCnc 123        Assessment & Plan    1. Type 2 diabetes mellitus without complication, with long-term current use of insulin (HCC) Well controlled.  Continue current medications.    2. Essential (primary) hypertension Well controlled.  Continue current medications.    3.  Mood disorder ([place]) Doing well on prn diazepam.   4. Smoking greater than 30 pack years Encouraged smoking cessation. He did not do well with medications in the past.      Lelon Huh, MD  [place]

## 2019-11-08 ENCOUNTER — Other Ambulatory Visit: Payer: Self-pay | Admitting: Family Medicine

## 2019-11-08 NOTE — Telephone Encounter (Signed)
Requested Prescriptions  Pending Prescriptions Disp Refills  . lisinopril-hydrochlorothiazide (ZESTORETIC) 10-12.5 MG tablet [Pharmacy Med Name: LISINOPRIL-HCTZ 10-12.5 MG TAB] 90 tablet 1    Sig: TAKE 1 TABLET BY MOUTH EVERY DAY     Cardiovascular:  ACEI + Diuretic Combos Failed - 11/08/2019  9:21 PM      Failed - Na in normal range and within 180 days    Sodium  Date Value Ref Range Status  04/03/2019 138 134 - 144 mmol/L Final         Failed - K in normal range and within 180 days    Potassium  Date Value Ref Range Status  04/03/2019 4.9 3.5 - 5.2 mmol/L Final         Failed - Cr in normal range and within 180 days    Creatinine, Ser  Date Value Ref Range Status  04/03/2019 0.92 0.76 - 1.27 mg/dL Final   Creatinine, POC  Date Value Ref Range Status  07/04/2017 n/a mg/dL Final         Failed - Ca in normal range and within 180 days    Calcium  Date Value Ref Range Status  04/03/2019 9.7 8.6 - 10.2 mg/dL Final         Passed - Patient is not pregnant      Passed - Last BP in normal range    BP Readings from Last 1 Encounters:  10/29/19 110/78         Passed - Valid encounter within last 6 months    Recent Outpatient Visits          1 week ago Type 2 diabetes mellitus without complication, with long-term current use of insulin ([place])   [facility] Birdie Sons, MD   5 months ago Need for influenza vaccination   [facility] Birdie Sons, MD   7 months ago Annual physical exam   [facility] Birdie Sons, MD   1 year ago Acute right-sided low back pain, unspecified whether sciatica present   [facility] Birdie Sons, MD   1 year ago Type 2 diabetes mellitus without complication, with long-term current use of insulin [facility])   [facility] Caryn Section, Kirstie Peri, MD

## 2019-11-15 ENCOUNTER — Other Ambulatory Visit: Payer: Self-pay | Admitting: Family Medicine

## 2019-11-15 DIAGNOSIS — M5020 Other cervical disc displacement, unspecified cervical region: Secondary | ICD-10-CM

## 2019-11-15 MED ORDER — HYDROCODONE-ACETAMINOPHEN 10-325 MG PO TABS: 1.0000 | ORAL_TABLET | Freq: Four times a day (QID) | ORAL | 0 refills | Status: DC | PRN

## 2019-11-15 NOTE — Telephone Encounter (Signed)
Medication Refill - Medication: HYDROcodone-acetaminophen (NORCO) 10-325 MG tablet   Has the patient contacted their pharmacy? No. (Agent: If no, request that the patient contact the pharmacy for the refill.) (Agent: If yes, when and what did the pharmacy advise?)  Preferred Pharmacy (with phone number or street name): [facility]   Agent: Please be advised that RX refills may take up to 3 business days. We ask that you follow-up with your pharmacy.

## 2019-11-15 NOTE — Telephone Encounter (Signed)
Requested medication (s) are due for refill today: yes  Requested medication (s) are on the active medication list: yes  Last refill: 10/09/19  Future visit scheduled: yes  Notes to clinic: Not delegated    Requested Prescriptions  Pending Prescriptions Disp Refills   HYDROcodone-acetaminophen (NORCO) 10-325 MG tablet 120 tablet 0    Sig: Take 1 tablet by mouth every 6 (six) hours as needed. Reviewed controlled substance use record in PMP Alert.      Not Delegated - Analgesics:  Opioid Agonist Combinations Failed - 11/15/2019  9:47 AM      Failed - This refill cannot be delegated      Failed - Urine Drug Screen completed in last 360 days.      Passed - Valid encounter within last 6 months    Recent Outpatient Visits           2 weeks ago Type 2 diabetes mellitus without complication, with long-term current use of insulin [facility])   [facility] Birdie Sons, MD   5 months ago Need for influenza vaccination   [facility] Birdie Sons, MD   7 months ago Annual physical exam   [facility] Birdie Sons, MD   1 year ago Acute right-sided low back pain, unspecified whether sciatica present   [facility] Birdie Sons, MD   1 year ago Type 2 diabetes mellitus without complication, with long-term current use of insulin [facility])   [facility] Birdie Sons, MD

## 2019-11-18 ENCOUNTER — Ambulatory Visit: Payer: BC Managed Care – PPO | Attending: Internal Medicine

## 2019-11-18 DIAGNOSIS — Z23 Encounter for immunization: Secondary | ICD-10-CM | POA: Insufficient documentation

## 2019-11-18 NOTE — Progress Notes (Signed)
   Covid-19 Vaccination Clinic  Name:  Antonio Howe    MRN: [id number] DOB: [birth year]  11/18/2019  Mr. Theil was observed post Covid-19 immunization for 15 minutes without incident. He was provided with Vaccine Information Sheet and instruction to access the V-Safe system.   Mr. Derman was instructed to call 911 with any severe reactions post vaccine: Marland Kitchen Difficulty breathing  . Swelling of face and throat  . A fast heartbeat  . A bad rash all over body  . Dizziness and weakness   Immunizations Administered    Name Date Dose VIS Date Route   Pfizer COVID-19 Vaccine 11/18/2019  8:23 AM 0.3 mL 08/24/2019 Intramuscular   Manufacturer: [place]   Lot: [id number]   [place]: [id number]

## 2019-12-04 ENCOUNTER — Encounter: Payer: Self-pay | Admitting: Family Medicine

## 2019-12-04 ENCOUNTER — Other Ambulatory Visit: Payer: Self-pay

## 2019-12-04 ENCOUNTER — Ambulatory Visit: Payer: BC Managed Care – PPO | Admitting: Family Medicine

## 2019-12-04 VITALS — BP 150/90 | HR 90 | Temp 96.9°F | Wt 172.0 lb

## 2019-12-04 DIAGNOSIS — M546 Pain in thoracic spine: Secondary | ICD-10-CM | POA: Diagnosis not present

## 2019-12-04 DIAGNOSIS — M62830 Muscle spasm of back: Secondary | ICD-10-CM | POA: Diagnosis not present

## 2019-12-04 DIAGNOSIS — L600 Ingrowing nail: Secondary | ICD-10-CM | POA: Diagnosis not present

## 2019-12-04 LAB — POCT URINALYSIS DIPSTICK
Bilirubin, UA: NEGATIVE
Blood, UA: NEGATIVE
Glucose, UA: NEGATIVE
Ketones, UA: NEGATIVE
Leukocytes, UA: NEGATIVE
Nitrite, UA: NEGATIVE
Protein, UA: POSITIVE — AB
Spec Grav, UA: 1.025 (ref 1.010–1.025)
Urobilinogen, UA: 0.2 E.U./dL
pH, UA: 6 (ref 5.0–8.0)

## 2019-12-04 MED ORDER — CYCLOBENZAPRINE HCL 5 MG PO TABS: 5.0000 mg | ORAL_TABLET | Freq: Three times a day (TID) | ORAL | 1 refills | Status: DC | PRN

## 2019-12-04 NOTE — Progress Notes (Signed)
Patient: Antonio Howe   DOB: [birth year]   59 y.o. Male  MRN: [id number] Visit Date: 12/04/2019  Today's Provider: Lelon Huh, MD  Subjective:    Chief Complaint  Patient presents with  . Back Pain    x 2-3 months   Back Pain This is a new problem. Episode onset: 2-3 months ago. The problem occurs intermittently. The problem has been gradually worsening since onset. The pain is present in the lumbar spine (right side). Pertinent negatives include no abdominal pain, chest pain, dysuria, fever, leg pain, numbness, paresis, paresthesias, pelvic pain, tingling, weakness or weight loss. Treatments tried: Hydrocodone- APAP. The treatment provided moderate relief.  Heat pad helps, doesn't keep up at night, but has been present just about every day for the last few weeks.       Medications: Outpatient Medications Prior to Visit  Medication Sig Dispense Refill  . aspirin 81 MG tablet Take 1 tablet by mouth daily.    . B COMPLEX VITAMINS PO Take 1 capsule by mouth daily.    . cyanocobalamin 100 MCG tablet Take 1 tablet by mouth daily.    . diazepam (VALIUM) 5 MG tablet TAKE 2 TABLETS BY MOUTH ONCE DAILY IF NEEDED 50 tablet 5  . glipiZIDE (GLUCOTROL XL) 2.5 MG 24 hr tablet TAKE 1 TABLET BY MOUTH DAILY 90 tablet 4  . glucose blood test strip 1 strip by Does not apply route daily.    Marland Kitchen HYDROcodone-acetaminophen (NORCO) 10-325 MG tablet Take 1 tablet by mouth every 6 (six) hours as needed. Reviewed controlled substance use record in PMP Alert. 120 tablet 0  . lisinopril-hydrochlorothiazide (ZESTORETIC) 10-12.5 MG tablet TAKE 1 TABLET BY MOUTH EVERY DAY 90 tablet 1   No facility-administered medications prior to visit.    Review of Systems  Constitutional: Negative for appetite change, chills, fever and weight loss.  Respiratory: Negative for chest tightness, shortness of breath and wheezing.   Cardiovascular: Negative for chest pain and palpitations.  Gastrointestinal: Negative  for abdominal pain, nausea and vomiting.  Genitourinary: Negative for dysuria and pelvic pain.  Musculoskeletal: Positive for back pain (lower right side).  Neurological: Negative for tingling, weakness, numbness and paresthesias.        Objective:   BP (!) 150/90 (BP Location: Right Arm, Cuff Size: Normal)   Pulse 90   Temp (!) 96.9 F (36.1 C) (Temporal)   Wt 172 lb (78 kg)   SpO2 98% Comment: room air  BMI 26.94 kg/m  Vitals:   12/04/19 1436 12/04/19 1438  BP: (!) 148/90 (!) 150/90  Pulse: 90   Temp: (!) 96.9 F (36.1 C)   TempSrc: Temporal   SpO2: 98%   Weight: 172 lb (78 kg)      Physical Exam    General: Appearance:     Overweight male in no acute distress  Eyes:    PERRL, conjunctiva/corneas clear, EOM's intact       Lungs:     Clear to auscultation bilaterally, respirations unlabored  Heart:    Normal heart rate. Normal rhythm. No murmurs, rubs, or gallops.   MS:   Just to right of lower thoracic spine, spasm noted.   Neurologic:   Awake, alert, oriented x 3. No apparent focal neurological           defect.       No results found for any visits on 12/04/19.    Assessment & Plan:    1. Muscle  spasm of back  - cyclobenzaprine (FLEXERIL) 5 MG tablet; Take 1 tablet (5 mg total) by mouth 3 (three) times daily as needed for muscle spasms.  Dispense: 30 tablet; Refill: 1  2. Ingrown toenail  - Ambulatory referral to Podiatry  3. Acute right-sided thoracic back pain      Lelon Huh, MD  [facility] [phone] (phone) [phone] (fax)  [place]

## 2019-12-06 NOTE — Progress Notes (Deleted)
    Established patient visit   {  This SmartText note template is in development as part of the [place].   This template contains optional SmartLists. If no selections are made from an optional SmartList, it will be automatically removed when the note is signed.  Left clicking SmartLists that are in blue, underlined text will show additional information regarding the patient's history unless you are already editing the note in a pop-up window.  This text will be automatically removed from this note when it is signed.:1}   Patient: Antonio Howe   DOB: [birth year]   59 y.o. Male  MRN: [id number] Visit Date: 12/06/2019  Today's healthcare provider: Lelon Huh, MD  Subjective:    Chief Complaint  Patient presents with  . Back Pain    x 2-3 months   HPI ***  {Show patient history (optional):23778::" "}   Medications: Outpatient Medications Prior to Visit  Medication Sig  . aspirin Take 1 tablet by mouth daily.  . B COMPLEX VITAMINS PO Take 1 capsule by mouth daily.  . cyanocobalamin Take 1 tablet by mouth daily.  . diazepam TAKE 2 TABLETS BY MOUTH ONCE DAILY IF NEEDED  . glipiZIDE TAKE 1 TABLET BY MOUTH DAILY  . glucose blood 1 strip by Does not apply route daily.  Marland Kitchen HYDROcodone-acetaminophen Take 1 tablet by mouth every 6 (six) hours as needed. Reviewed controlled substance use record in PMP Alert.  Marland Kitchen lisinopril-hydrochlorothiazide TAKE 1 TABLET BY MOUTH EVERY DAY   No facility-administered medications prior to visit.    Review of Systems  {Show previous labs (optional):23779::" "}    Objective:    BP (!) 150/90 (BP Location: Right Arm, Cuff Size: Normal)   Pulse 90   Temp (!) 96.9 F (36.1 C) (Temporal)   Wt 172 lb (78 kg)   SpO2 98% Comment: room air  BMI 26.94 kg/m  {Show previous vitals signs (optional):23777::" "}  Physical Exam  *** Results for orders placed or performed in visit on 12/04/19  POCT urinalysis dipstick    Result Value Ref Range   Color, UA yellow    Clarity, UA clear    Glucose, UA Negative Negative   Bilirubin, UA negative    Ketones, UA negative    Spec Grav, UA 1.025 1.010 - 1.025   Blood, UA negative    pH, UA 6.0 5.0 - 8.0   Protein, UA Positive (A) Negative   Urobilinogen, UA 0.2 0.2 or 1.0 E.U./dL   Nitrite, UA negative    Leukocytes, UA Negative Negative   Appearance     Odor        Assessment & Plan:    ***     Lelon Huh, MD  [facility] [phone] (phone) [phone] (fax)  [place]

## 2019-12-07 ENCOUNTER — Ambulatory Visit: Payer: BC Managed Care – PPO | Admitting: Family Medicine

## 2019-12-07 ENCOUNTER — Ambulatory Visit: Payer: Self-pay | Admitting: Family Medicine

## 2019-12-11 ENCOUNTER — Ambulatory Visit: Payer: BC Managed Care – PPO | Attending: Internal Medicine

## 2019-12-11 DIAGNOSIS — Z23 Encounter for immunization: Secondary | ICD-10-CM

## 2019-12-11 NOTE — Progress Notes (Signed)
   Covid-19 Vaccination Clinic  Name:  DIJOHN REGEHR    MRN: [id number] DOB: 03-02-1961  12/11/2019  Mr. Tiedt was observed post Covid-19 immunization for 15 minutes without incident. He was provided with Vaccine Information Sheet and instruction to access the V-Safe system.   Mr. Rahl was instructed to call 911 with any severe reactions post vaccine: Marland Kitchen Difficulty breathing  . Swelling of face and throat  . A fast heartbeat  . A bad rash all over body  . Dizziness and weakness   Immunizations Administered    Name Date Dose VIS Date Route   Pfizer COVID-19 Vaccine 12/11/2019  8:15 AM 0.3 mL 08/24/2019 Intramuscular   Manufacturer: [place]   Lot: [id number]   [place]: [id number]

## 2019-12-19 ENCOUNTER — Other Ambulatory Visit: Payer: Self-pay | Admitting: Family Medicine

## 2019-12-19 DIAGNOSIS — M5020 Other cervical disc displacement, unspecified cervical region: Secondary | ICD-10-CM

## 2019-12-19 NOTE — Telephone Encounter (Signed)
Medication Refill - Medication: hydrocodone   Has the patient contacted their pharmacy? Yes.   (Agent: If no, request that the patient contact the pharmacy for the refill.) (Agent: If yes, when and what did the pharmacy advise?)  Preferred Pharmacy (with phone number or street name):  [id number] [place], [address]  [address] [address]  Phone: [phone] Fax: [phone]  Not a 24 hour pharmacy; exact hours not known.     Agent: Please be advised that RX refills may take up to 3 business days. We ask that you follow-up with your pharmacy.

## 2019-12-19 NOTE — Telephone Encounter (Signed)
Requested medication (s) are due for refill today: yes  Requested medication (s) are on the active medication list:  yes  Last refill:  11/15/2019  Future visit scheduled: yes  Notes to clinic: this refill cannot be delegated    Requested Prescriptions  Pending Prescriptions Disp Refills   HYDROcodone-acetaminophen (NORCO) 10-325 MG tablet 120 tablet 0    Sig: Take 1 tablet by mouth every 6 (six) hours as needed. Reviewed controlled substance use record in PMP Alert.      Not Delegated - Analgesics:  Opioid Agonist Combinations Failed - 12/19/2019  2:22 PM      Failed - This refill cannot be delegated      Failed - Urine Drug Screen completed in last 360 days.      Passed - Valid encounter within last 6 months    Recent Outpatient Visits           2 weeks ago Muscle spasm of back   [facility] Birdie Sons, MD   1 month ago Type 2 diabetes mellitus without complication, with long-term current use of insulin [facility])   [facility] Birdie Sons, MD   6 months ago Need for influenza vaccination   [facility] Birdie Sons, MD   8 months ago Annual physical exam   [facility] Birdie Sons, MD   1 year ago Acute right-sided low back pain, unspecified whether sciatica present   [facility] Birdie Sons, MD

## 2019-12-20 MED ORDER — HYDROCODONE-ACETAMINOPHEN 10-325 MG PO TABS: 1.0000 | ORAL_TABLET | Freq: Four times a day (QID) | ORAL | 0 refills | Status: DC | PRN

## 2019-12-25 ENCOUNTER — Ambulatory Visit: Payer: Self-pay | Admitting: Podiatry

## 2019-12-27 ENCOUNTER — Encounter: Payer: Self-pay | Admitting: Podiatry

## 2019-12-27 ENCOUNTER — Other Ambulatory Visit: Payer: Self-pay

## 2019-12-27 ENCOUNTER — Ambulatory Visit: Payer: BC Managed Care – PPO | Admitting: Podiatry

## 2019-12-27 VITALS — Temp 97.6°F

## 2019-12-27 DIAGNOSIS — L600 Ingrowing nail: Secondary | ICD-10-CM | POA: Diagnosis not present

## 2019-12-28 ENCOUNTER — Encounter: Payer: Self-pay | Admitting: Podiatry

## 2019-12-28 NOTE — Progress Notes (Signed)
Subjective:  Patient ID: Antonio Howe, male    DOB: May 30, 1961,  MRN: [id number]  Chief Complaint  Patient presents with  . Ingrown Toenail    Patient presents today for ingrown toenails bilat hallux x months off and on.  He says they aren't bad now, but can get very tender to touch.  the right is a little worse at the moment.      59 y.o. male presents with the above complaint.  Patient presents with bilateral medial border hallux painful ingrown.  Patient states that self debridement has not helped.  He has been going for quite some time.  He has been managing it himself which has not helped.  He has tried soaking his feet which has not helped.  He would like to have them taken out.  He denies any other acute complaints.   Review of Systems: Negative except as noted in the HPI. Denies N/V/F/Ch.  Past Medical History:  Diagnosis Date  . Dysuria   . History of chicken pox   . History of MRI of cervical spine 05/29/2011   Prominent left paracentral C6-C7 disk protrusion  . Hypertension   . Wears dentures full upper and lower    Current Outpatient Medications:  .  aspirin 81 MG tablet, Take 1 tablet by mouth daily., Disp: , Rfl:  .  B COMPLEX VITAMINS PO, Take 1 capsule by mouth daily., Disp: , Rfl:  .  cyclobenzaprine (FLEXERIL) 5 MG tablet, Take 1 tablet (5 mg total) by mouth 3 (three) times daily as needed for muscle spasms., Disp: 30 tablet, Rfl: 1 .  diazepam (VALIUM) 5 MG tablet, TAKE 2 TABLETS BY MOUTH ONCE DAILY IF NEEDED, Disp: 50 tablet, Rfl: 5 .  glipiZIDE (GLUCOTROL XL) 2.5 MG 24 hr tablet, TAKE 1 TABLET BY MOUTH DAILY, Disp: 90 tablet, Rfl: 4 .  glucose blood test strip, 1 strip by Does not apply route daily., Disp: , Rfl:  .  HYDROcodone-acetaminophen (NORCO) 10-325 MG tablet, Take 1 tablet by mouth every 6 (six) hours as needed. Reviewed controlled substance use record in PMP Alert., Disp: 120 tablet, Rfl: 0 .  lisinopril-hydrochlorothiazide (ZESTORETIC) 10-12.5 MG  tablet, TAKE 1 TABLET BY MOUTH EVERY DAY, Disp: 90 tablet, Rfl: 1  Social History   Tobacco Use  Smoking Status Current Every Day Smoker  . Packs/day: 1.50  . Years: 30.00  . Pack years: 45.00  . Types: Cigarettes  Smokeless Tobacco Never Used  Tobacco Comment   started smoking at age 19    No Known Allergies Objective:   Vitals:   12/27/19 1417  Temp: 97.6 F (36.4 C)   There is no height or weight on file to calculate BMI. Constitutional Well developed. Well nourished.  Vascular Dorsalis pedis pulses palpable bilaterally. Posterior tibial pulses palpable bilaterally. Capillary refill normal to all digits.  No cyanosis or clubbing noted. Pedal hair growth normal.  Neurologic Normal speech. Oriented to person, place, and time. Epicritic sensation to light touch grossly present bilaterally.  Dermatologic Painful ingrowing nail at medial nail borders of the hallux nail bilaterally. No other open wounds. No skin lesions.  Orthopedic: Normal joint ROM without pain or crepitus bilaterally. No visible deformities. No bony tenderness.   Radiographs: None Assessment:   1. Ingrown toenail of right foot   2. Ingrown left big toenail    Plan:  Patient was evaluated and treated and all questions answered.  Ingrown Nail, bilaterally -Patient elects to proceed with minor surgery to remove  ingrown toenail removal today. Consent reviewed and signed by patient. -Ingrown nail excised. See procedure note. -Educated on post-procedure care including soaking. Written instructions provided and reviewed. -Patient to follow up in 2 weeks for nail check.  Procedure: Excision of Ingrown Toenail Location: Bilateral 1st toe medial nail borders. Anesthesia: Lidocaine 1% plain; 1.5 mL and Marcaine 0.5% plain; 1.5 mL, digital block. Skin Prep: Betadine. Dressing: Silvadene; telfa; dry, sterile, compression dressing. Technique: Following skin prep, the toe was exsanguinated and a  tourniquet was secured at the base of the toe. The affected nail border was freed, split with a nail splitter, and excised. Chemical matrixectomy was then performed with phenol and irrigated out with alcohol. The tourniquet was then removed and sterile dressing applied. Disposition: Patient tolerated procedure well. Patient to return in 2 weeks for follow-up.   No follow-ups on file.

## 2020-01-10 ENCOUNTER — Other Ambulatory Visit: Payer: Self-pay

## 2020-01-10 ENCOUNTER — Encounter: Payer: Self-pay | Admitting: Podiatry

## 2020-01-10 ENCOUNTER — Ambulatory Visit: Payer: BC Managed Care – PPO | Admitting: Podiatry

## 2020-01-10 VITALS — Temp 97.5°F

## 2020-01-10 DIAGNOSIS — L603 Nail dystrophy: Secondary | ICD-10-CM | POA: Diagnosis not present

## 2020-01-10 DIAGNOSIS — L600 Ingrowing nail: Secondary | ICD-10-CM

## 2020-01-11 ENCOUNTER — Encounter: Payer: Self-pay | Admitting: Podiatry

## 2020-01-11 NOTE — Progress Notes (Signed)
Subjective:  Patient ID: Antonio Howe, male    DOB: 1961/02/26,  MRN: [id number]  Chief Complaint  Patient presents with  . Ingrown Toenail    59 y.o. male presents with the above complaint.  Patient presents with complaint of bilateral lateral border of the hallux.  Patient states the medial border is doing really well.  He would like to have the lateral side removed as causing him pain while ambulating.  He states he has been doing Epson salt soaks which has not helped.  He denies any other acute complaints.   Review of Systems: Negative except as noted in the HPI. Denies N/V/F/Ch.  Past Medical History:  Diagnosis Date  . Dysuria   . History of chicken pox   . History of MRI of cervical spine 05/29/2011   Prominent left paracentral C6-C7 disk protrusion  . Hypertension   . Wears dentures full upper and lower    Current Outpatient Medications:  .  aspirin 81 MG tablet, Take 1 tablet by mouth daily., Disp: , Rfl:  .  B COMPLEX VITAMINS PO, Take 1 capsule by mouth daily., Disp: , Rfl:  .  cyclobenzaprine (FLEXERIL) 5 MG tablet, Take 1 tablet (5 mg total) by mouth 3 (three) times daily as needed for muscle spasms., Disp: 30 tablet, Rfl: 1 .  diazepam (VALIUM) 5 MG tablet, TAKE 2 TABLETS BY MOUTH ONCE DAILY IF NEEDED, Disp: 50 tablet, Rfl: 5 .  glipiZIDE (GLUCOTROL XL) 2.5 MG 24 hr tablet, TAKE 1 TABLET BY MOUTH DAILY, Disp: 90 tablet, Rfl: 4 .  glucose blood test strip, 1 strip by Does not apply route daily., Disp: , Rfl:  .  HYDROcodone-acetaminophen (NORCO) 10-325 MG tablet, Take 1 tablet by mouth every 6 (six) hours as needed. Reviewed controlled substance use record in PMP Alert., Disp: 120 tablet, Rfl: 0 .  lisinopril-hydrochlorothiazide (ZESTORETIC) 10-12.5 MG tablet, TAKE 1 TABLET BY MOUTH EVERY DAY, Disp: 90 tablet, Rfl: 1  Social History   Tobacco Use  Smoking Status Current Every Day Smoker  . Packs/day: 1.50  . Years: 30.00  . Pack years: 45.00  . Types:  Cigarettes  Smokeless Tobacco Never Used  Tobacco Comment   started smoking at age 89    No Known Allergies Objective:   Vitals:   01/10/20 1456  Temp: (!) 97.5 F (36.4 C)   There is no height or weight on file to calculate BMI. Constitutional Well developed. Well nourished.  Vascular Dorsalis pedis pulses palpable bilaterally. Posterior tibial pulses palpable bilaterally. Capillary refill normal to all digits.  No cyanosis or clubbing noted. Pedal hair growth normal.  Neurologic Normal speech. Oriented to person, place, and time. Epicritic sensation to light touch grossly present bilaterally.  Dermatologic Painful ingrowing nail at lateral nail borders of the hallux nail bilaterally. No other open wounds. No skin lesions.  Orthopedic: Normal joint ROM without pain or crepitus bilaterally. No visible deformities. No bony tenderness.   Radiographs: None Assessment:   1. Ingrown left big toenail   2. Ingrown toenail of right foot   3. Nail dystrophy    Plan:  Patient was evaluated and treated and all questions answered.  Ingrown Nail, bilaterally/nail dystrophy -Patient elects to proceed with minor surgery to remove ingrown toenail removal today. Consent reviewed and signed by patient. -Ingrown nail excised. See procedure note. -Educated on post-procedure care including soaking. Written instructions provided and reviewed. -Patient to follow up in 2 weeks for nail check.  Procedure: Excision of Ingrown  Toenail Location: Bilateral 1st toe lateral nail borders. Anesthesia: Lidocaine 1% plain; 1.5 mL and Marcaine 0.5% plain; 1.5 mL, digital block. Skin Prep: Betadine. Dressing: Silvadene; telfa; dry, sterile, compression dressing. Technique: Following skin prep, the toe was exsanguinated and a tourniquet was secured at the base of the toe. The affected nail border was freed, split with a nail splitter, and excised. Chemical matrixectomy was then performed with phenol  and irrigated out with alcohol. The tourniquet was then removed and sterile dressing applied. Disposition: Patient tolerated procedure well. Patient to return in 2 weeks for follow-up.   No follow-ups on file.

## 2020-01-17 ENCOUNTER — Other Ambulatory Visit: Payer: Self-pay | Admitting: Family Medicine

## 2020-01-17 DIAGNOSIS — M5020 Other cervical disc displacement, unspecified cervical region: Secondary | ICD-10-CM

## 2020-01-17 DIAGNOSIS — F39 Unspecified mood [affective] disorder: Secondary | ICD-10-CM

## 2020-01-17 MED ORDER — HYDROCODONE-ACETAMINOPHEN 10-325 MG PO TABS: 1.0000 | ORAL_TABLET | Freq: Four times a day (QID) | ORAL | 0 refills | Status: DC | PRN

## 2020-01-17 NOTE — Telephone Encounter (Signed)
Requested medication (s) are due for refill today: yes  Requested medication (s) are on the active medication list: yes  Last refill:  06/24/2019  #50   5 refills  Future visit scheduled  Yes  03/31/2020  Notes to clinic: not delegated  Requested Prescriptions  Pending Prescriptions Disp Refills   diazepam (VALIUM) 5 MG tablet [Pharmacy Med Name: DIAZEPAM 5 MG TABLET] 50 tablet     Sig: TAKE 2 TABLETS BY MOUTH ONCE DAILY IF NEEDED      Not Delegated - Psychiatry:  Anxiolytics/Hypnotics Failed - 01/17/2020  5:34 PM      Failed - This refill cannot be delegated      Failed - Urine Drug Screen completed in last 360 days.      Passed - Valid encounter within last 6 months    Recent Outpatient Visits           1 month ago Muscle spasm of back   [facility] Birdie Sons, MD   2 months ago Type 2 diabetes mellitus without complication, with long-term current use of insulin [facility])   [facility] Birdie Sons, MD   7 months ago Need for influenza vaccination   [facility] Birdie Sons, MD   9 months ago Annual physical exam   [facility] Birdie Sons, MD   1 year ago Acute right-sided low back pain, unspecified whether sciatica present   [facility] Birdie Sons, MD

## 2020-01-17 NOTE — Telephone Encounter (Signed)
Copied from [place] [id number]. Topic: Quick Communication - Rx Refill/Question >> Jan 17, 2020 10:55 AM Rainey Pines A wrote: Medication: HYDROcodone-acetaminophen (NORCO) 10-325 MG tablet  Has the patient contacted their pharmacy? Yes (Agent: If no, request that the patient contact the pharmacy for the refill.) (Agent: If yes, when and what did the pharmacy advise?)Contact PCP  Preferred Pharmacy (with phone number or street name[id number] [place], [place] [address]  Phone:  [phone] Fax:  [phone]     Agent: Please be advised that RX refills may take up to 3 business days. We ask that you follow-up with your pharmacy.

## 2020-01-30 ENCOUNTER — Other Ambulatory Visit: Payer: Self-pay | Admitting: Family Medicine

## 2020-01-30 NOTE — Telephone Encounter (Signed)
Requested Prescriptions  Pending Prescriptions Disp Refills  . glipiZIDE (GLUCOTROL XL) 2.5 MG 24 hr tablet [Pharmacy Med Name: GLIPIZIDE ER 2.5 MG TABLET] 90 tablet 0    Sig: TAKE 1 TABLET BY MOUTH EVERY DAY     Endocrinology:  Diabetes - Sulfonylureas Passed - 01/30/2020  1:19 AM      Passed - HBA1C is between 0 and 7.9 and within 180 days    Hemoglobin A1C  Date Value Ref Range Status  10/29/2019 5.9 (A) 4.0 - 5.6 % Final   Hgb A1c MFr Bld  Date Value Ref Range Status  04/03/2019 5.9 (H) 4.8 - 5.6 % Final    Comment:             Prediabetes: 5.7 - 6.4          Diabetes: >6.4          Glycemic control for adults with diabetes: <7.0          Passed - Valid encounter within last 6 months    Recent Outpatient Visits          1 month ago Muscle spasm of back   [facility] Birdie Sons, MD   3 months ago Type 2 diabetes mellitus without complication, with long-term current use of insulin [facility])   [facility] Birdie Sons, MD   7 months ago Need for influenza vaccination   [facility] Birdie Sons, MD   10 months ago Annual physical exam   [facility] Birdie Sons, MD   1 year ago Acute right-sided low back pain, unspecified whether sciatica present   [facility] Birdie Sons, MD

## 2020-02-15 ENCOUNTER — Other Ambulatory Visit: Payer: Self-pay | Admitting: Family Medicine

## 2020-02-15 DIAGNOSIS — M5020 Other cervical disc displacement, unspecified cervical region: Secondary | ICD-10-CM

## 2020-02-15 NOTE — Telephone Encounter (Signed)
Pt is requesting a refill for HYDROcodone-acetaminophen (NORCO) 10-325 MG tablet    [facility]  [id number] - [place], [place] [address] Phone:  [phone]  Fax:  [phone]

## 2020-02-17 MED ORDER — HYDROCODONE-ACETAMINOPHEN 10-325 MG PO TABS: 1.0000 | ORAL_TABLET | Freq: Four times a day (QID) | ORAL | 0 refills | Status: DC | PRN

## 2020-03-18 ENCOUNTER — Other Ambulatory Visit: Payer: Self-pay | Admitting: Family Medicine

## 2020-03-18 DIAGNOSIS — M5020 Other cervical disc displacement, unspecified cervical region: Secondary | ICD-10-CM

## 2020-03-18 NOTE — Telephone Encounter (Signed)
Copied from [place] [id number]. Topic: Quick Communication - Rx Refill/Question >> Mar 18, 2020  1:23 PM Leward Quan A wrote: Medication: HYDROcodone-acetaminophen (NORCO) 10-325 MG tablet   Has the patient contacted their pharmacy? Yes.   (Agent: If no, request that the patient contact the pharmacy for the refill.) (Agent: If yes, when and what did the pharmacy advise?)  Preferred Pharmacy (with phone number or street name): [id number] - [place], [place] [address]  Phone:  [phone] Fax:  [phone]     Agent: Please be advised that RX refills may take up to 3 business days. We ask that you follow-up with your pharmacy.

## 2020-03-18 NOTE — Telephone Encounter (Signed)
Requested medication (s) are due for refill today: yes  Requested medication (s) are on the active medication list: yes  Last refill:  02/15/2020  Future visit scheduled: yes  Notes to clinic:  this refill cannot be delegated    Requested Prescriptions  Pending Prescriptions Disp Refills   HYDROcodone-acetaminophen (NORCO) 10-325 MG tablet 120 tablet 0    Sig: Take 1 tablet by mouth every 6 (six) hours as needed.      Not Delegated - Analgesics:  Opioid Agonist Combinations Failed - 03/18/2020  1:29 PM      Failed - This refill cannot be delegated      Failed - Urine Drug Screen completed in last 360 days.      Passed - Valid encounter within last 6 months    Recent Outpatient Visits           3 months ago Muscle spasm of back   [facility] Birdie Sons, MD   4 months ago Type 2 diabetes mellitus without complication, with long-term current use of insulin [facility])   [facility] Birdie Sons, MD   9 months ago Need for influenza vaccination   [facility] Birdie Sons, MD   11 months ago Annual physical exam   [facility] Birdie Sons, MD   1 year ago Acute right-sided low back pain, unspecified whether sciatica present   [facility] Birdie Sons, MD       Future Appointments             In 1 week Fisher, Kirstie Peri, MD [facility] [place]

## 2020-03-19 MED ORDER — HYDROCODONE-ACETAMINOPHEN 10-325 MG PO TABS: 1.0000 | ORAL_TABLET | Freq: Four times a day (QID) | ORAL | 0 refills | Status: DC | PRN

## 2020-03-31 ENCOUNTER — Ambulatory Visit (INDEPENDENT_AMBULATORY_CARE_PROVIDER_SITE_OTHER): Payer: BC Managed Care – PPO | Admitting: Family Medicine

## 2020-03-31 ENCOUNTER — Other Ambulatory Visit: Payer: Self-pay

## 2020-03-31 ENCOUNTER — Encounter: Payer: Self-pay | Admitting: Family Medicine

## 2020-03-31 VITALS — BP 159/84 | HR 83 | Temp 97.5°F | Ht 67.0 in | Wt 174.0 lb

## 2020-03-31 DIAGNOSIS — Z125 Encounter for screening for malignant neoplasm of prostate: Secondary | ICD-10-CM

## 2020-03-31 DIAGNOSIS — L4050 Arthropathic psoriasis, unspecified: Secondary | ICD-10-CM | POA: Insufficient documentation

## 2020-03-31 DIAGNOSIS — E119 Type 2 diabetes mellitus without complications: Secondary | ICD-10-CM | POA: Diagnosis not present

## 2020-03-31 DIAGNOSIS — I10 Essential (primary) hypertension: Secondary | ICD-10-CM

## 2020-03-31 DIAGNOSIS — Z136 Encounter for screening for cardiovascular disorders: Secondary | ICD-10-CM

## 2020-03-31 DIAGNOSIS — Z0000 Encounter for general adult medical examination without abnormal findings: Secondary | ICD-10-CM

## 2020-03-31 DIAGNOSIS — Z794 Long term (current) use of insulin: Secondary | ICD-10-CM

## 2020-03-31 DIAGNOSIS — Z114 Encounter for screening for human immunodeficiency virus [HIV]: Secondary | ICD-10-CM

## 2020-03-31 DIAGNOSIS — F17210 Nicotine dependence, cigarettes, uncomplicated: Secondary | ICD-10-CM
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
## 2020-03-31 NOTE — Patient Instructions (Addendum)
.   Please review the attached list of medications and notify my office if there are any errors.   . Limit alcohol consumption to no more than 2 servings per day, or 10 servings per week.    Please stop smoking  . Please contact your eyecare professional to schedule a routine eye exam

## 2020-04-02 DIAGNOSIS — Z114 Encounter for screening for human immunodeficiency virus [HIV]: Secondary | ICD-10-CM | POA: Diagnosis not present

## 2020-04-02 DIAGNOSIS — Z125 Encounter for screening for malignant neoplasm of prostate: Secondary | ICD-10-CM | POA: Diagnosis not present

## 2020-04-02 DIAGNOSIS — Z136 Encounter for screening for cardiovascular disorders: Secondary | ICD-10-CM | POA: Diagnosis not present

## 2020-04-02 DIAGNOSIS — E119 Type 2 diabetes mellitus without complications: Secondary | ICD-10-CM | POA: Diagnosis not present

## 2020-04-02 DIAGNOSIS — Z794 Long term (current) use of insulin: Secondary | ICD-10-CM | POA: Diagnosis not present

## 2020-04-02 DIAGNOSIS — I10 Essential (primary) hypertension: Secondary | ICD-10-CM | POA: Diagnosis not present

## 2020-04-03 LAB — HIV ANTIBODY (ROUTINE TESTING W REFLEX): HIV Screen 4th Generation wRfx: NONREACTIVE

## 2020-04-03 LAB — COMPREHENSIVE METABOLIC PANEL
ALT: 40 IU/L (ref 0–44)
AST: 32 IU/L (ref 0–40)
Albumin/Globulin Ratio: 2.3 — ABNORMAL HIGH (ref 1.2–2.2)
Albumin: 5 g/dL — ABNORMAL HIGH (ref 3.8–4.8)
Alkaline Phosphatase: 66 IU/L (ref 48–121)
BUN/Creatinine Ratio: 18 (ref 10–24)
BUN: 15 mg/dL (ref 8–27)
Bilirubin Total: 1.2 mg/dL (ref 0.0–1.2)
CO2: 22 mmol/L (ref 20–29)
Calcium: 9.7 mg/dL (ref 8.6–10.2)
Chloride: 97 mmol/L (ref 96–106)
Creatinine, Ser: 0.84 mg/dL (ref 0.76–1.27)
GFR calc Af Amer: 109 mL/min/{1.73_m2} (ref >59)
GFR calc non Af Amer: 95 mL/min/{1.73_m2} (ref >59)
Globulin, Total: 2.2 g/dL (ref 1.5–4.5)
Glucose: 151 mg/dL — ABNORMAL HIGH (ref 65–99)
Potassium: 4.9 mmol/L (ref 3.5–5.2)
Sodium: 136 mmol/L (ref 134–144)
Total Protein: 7.2 g/dL (ref 6.0–8.5)

## 2020-04-03 LAB — LIPID PANEL
Chol/HDL Ratio: 2.7 ratio (ref 0.0–5.0)
Cholesterol, Total: 149 mg/dL (ref 100–199)
HDL: 56 mg/dL (ref >39)
LDL Chol Calc (NIH): 79 mg/dL (ref 0–99)
Triglycerides: 73 mg/dL (ref 0–149)
VLDL Cholesterol Cal: 14 mg/dL (ref 5–40)

## 2020-04-03 LAB — CBC
Hematocrit: 48.7 % (ref 37.5–51.0)
Hemoglobin: 16.6 g/dL (ref 13.0–17.7)
MCH: 32.7 pg (ref 26.6–33.0)
MCHC: 34.1 g/dL (ref 31.5–35.7)
MCV: 96 fL (ref 79–97)
Platelets: 219 10*3/uL (ref 150–450)
RBC: 5.07 x10E6/uL (ref 4.14–5.80)
RDW: 11.9 % (ref 11.6–15.4)
WBC: 5.7 10*3/uL (ref 3.4–10.8)

## 2020-04-03 LAB — HEMOGLOBIN A1C
Est. average glucose Bld gHb Est-mCnc: 123 mg/dL
Hgb A1c MFr Bld: 5.9 % — ABNORMAL HIGH (ref 4.8–5.6)

## 2020-04-03 LAB — TSH: TSH: 0.965 u[IU]/mL (ref 0.450–4.500)

## 2020-04-03 LAB — PSA: Prostate Specific Ag, Serum: 0.5 ng/mL (ref 0.0–4.0)

## 2020-04-07 ENCOUNTER — Telehealth: Payer: Self-pay

## 2020-04-07 DIAGNOSIS — F17210 Nicotine dependence, cigarettes, uncomplicated: Secondary | ICD-10-CM

## 2020-04-07 NOTE — Telephone Encounter (Signed)
-----   Message from Birdie Sons, MD sent at 04/03/2020  8:28 AM EDT ----- A1c is good at 5.9. rest of labs are normal. Cholesterol is slightly high for a diabetic. Will need to keep an eye on it. Also, I can't remember if I mentioned that lung cancer screening is recommended for smokers of more than 30 years. This is a quick low dose CT scan of the lungs that has been proven to detect lung tumors at an early treatable stage. If he is interested we can send a referral over to the [facility] to schedule. I do recommend having it done.   He needs to schedule follow up for diabetes and blood pressure check in 6 months.

## 2020-04-07 NOTE — Telephone Encounter (Signed)
Patient advised of results and verbalized understanding. Follow up appointment scheduled for 10/10/2020 at 1:40pm. Patient agrees to have lung cancer screening done. Im not sure how to order this. Please review order.

## 2020-04-16 ENCOUNTER — Other Ambulatory Visit: Payer: Self-pay | Admitting: Family Medicine

## 2020-04-16 DIAGNOSIS — M5020 Other cervical disc displacement, unspecified cervical region: Secondary | ICD-10-CM

## 2020-04-16 MED ORDER — HYDROCODONE-ACETAMINOPHEN 10-325 MG PO TABS: 1.0000 | ORAL_TABLET | Freq: Four times a day (QID) | ORAL | 0 refills | Status: DC | PRN

## 2020-04-16 NOTE — Telephone Encounter (Signed)
Copied from [place] [id number]. Topic: Quick Communication - Rx Refill/Question >> Apr 16, 2020  1:53 PM Leward Quan A wrote: Medication: HYDROcodone-acetaminophen (NORCO) 10-325 MG tablet   Has the patient contacted their pharmacy? No. (Agent: If no, request that the patient contact the pharmacy for the refill.) (Agent: If yes, when and what did the pharmacy advise?)  Preferred Pharmacy (with phone number [facility] street name[id number] [place], [place] [address]  Phone:  [phone] Fax:  [phone]     Agent: Please be advised that RX refills may take up to 3 business days. We ask that you follow-up with your pharmacy.

## 2020-04-17 ENCOUNTER — Encounter: Payer: Self-pay | Admitting: *Deleted

## 2020-04-17 ENCOUNTER — Telehealth: Payer: Self-pay | Admitting: *Deleted

## 2020-04-17 DIAGNOSIS — Z122 Encounter for screening for malignant neoplasm of respiratory organs: Secondary | ICD-10-CM

## 2020-04-17 DIAGNOSIS — Z87891 Personal history of nicotine dependence: Secondary | ICD-10-CM

## 2020-04-17 NOTE — Telephone Encounter (Signed)
Received referral for initial lung cancer screening scan. Contacted patient and obtained smoking history,(current, 64.5 pack year) as well as answering questions related to screening process. Patient denies signs of lung cancer such as weight loss or hemoptysis. Patient denies comorbidity that would prevent curative treatment if lung cancer were found. Patient is scheduled for shared decision making visit and CT scan on 04/23/20 at 1045am.

## 2020-04-23 ENCOUNTER — Other Ambulatory Visit: Payer: Self-pay

## 2020-04-23 ENCOUNTER — Ambulatory Visit
Admission: RE | Admit: 2020-04-23 | Discharge: 2020-04-23 | Disposition: A | Discharge status: Home / Self Care | Payer: BC Managed Care – PPO | Source: Ambulatory Visit | Attending: Oncology | Admitting: Oncology

## 2020-04-23 ENCOUNTER — Inpatient Hospital Stay: Payer: BC Managed Care – PPO | Attending: Oncology | Admitting: Oncology

## 2020-04-23 DIAGNOSIS — Z87891 Personal history of nicotine dependence: Secondary | ICD-10-CM

## 2020-04-23 DIAGNOSIS — Z122 Encounter for screening for malignant neoplasm of respiratory organs: Secondary | ICD-10-CM | POA: Insufficient documentation

## 2020-04-23 DIAGNOSIS — F17210 Nicotine dependence, cigarettes, uncomplicated: Secondary | ICD-10-CM | POA: Diagnosis not present

## 2020-04-23 NOTE — Progress Notes (Signed)
Virtual Visit via Video Note  I connected with Mr. Antonio Howe on 04/23/20 at 10:45 AM EDT by a video enabled telemedicine application and verified that I am speaking with the correct person using two identifiers.  Location: Patient: [facility] Provider: Clinic    I discussed the limitations of evaluation and management by telemedicine and the availability of in person appointments. The patient expressed understanding and agreed to proceed.  I discussed the assessment and treatment plan with the patient. The patient was provided an opportunity to ask questions and all were answered. The patient agreed with the plan and demonstrated an understanding of the instructions.   The patient was advised to call back or seek an in-person evaluation if the symptoms worsen or if the condition fails to improve as anticipated.   In accordance with CMS guidelines, patient has met eligibility criteria including age, absence of signs or symptoms of lung cancer.  Social History   Tobacco Use  . Smoking status: Current Every Day Smoker    Packs/day: 1.50    Years: 43.00    Pack years: 64.50    Types: Cigarettes  . Smokeless tobacco: Never Used  . Tobacco comment: started smoking at age 54  Vaping Use  . Vaping Use: Never used  Substance Use Topics  . Alcohol use: Yes    Alcohol/week: 35.0 standard drinks    Types: 35 Cans of beer per week    Comment:    . Drug use: No      A shared decision-making session was conducted prior to the performance of CT scan. This includes one or more decision aids, includes benefits and harms of screening, follow-up diagnostic testing, over-diagnosis, false positive rate, and total radiation exposure.   Counseling on the importance of adherence to annual lung cancer LDCT screening, impact of co-morbidities, and ability or willingness to undergo diagnosis and treatment is imperative for compliance of the program.   Counseling on the importance of continued smoking cessation for  former smokers; the importance of smoking cessation for current smokers, and information about tobacco cessation interventions have been given to patient including [place] and [employer] quit [place] programs.   Written order for lung cancer screening with LDCT has been given to the patient and any and all questions have been answered to the best of my abilities.    Yearly follow up will be coordinated by Burgess Estelle, Thoracic Navigator.  I provided 15 minutes of face-to-face video visit time during this encounter, and > 50% was spent counseling as documented under my assessment & plan.   Jacquelin Hawking, NP

## 2020-04-24 ENCOUNTER — Telehealth: Payer: Self-pay | Admitting: *Deleted

## 2020-04-24 NOTE — Telephone Encounter (Signed)
Notified patient of LDCT lung cancer screening program results with recommendation for 12 month follow up imaging. Also notified of incidental findings noted below and is encouraged to discuss further with PCP who will receive a copy of this note and/or the CT report. Patient verbalizes understanding.   IMPRESSION: 1. Lung-RADS 2, benign appearance or behavior. Continue annual screening with low-dose chest CT without contrast in 12 months. 2. Aortic atherosclerosis (ICD10-I70.0). Coronary artery calcification. 3.  Emphysema (ICD10-J43.9).  

## 2020-04-28 ENCOUNTER — Encounter: Payer: Self-pay | Admitting: Family Medicine

## 2020-04-28 DIAGNOSIS — I700 Atherosclerosis of aorta: Secondary | ICD-10-CM | POA: Insufficient documentation

## 2020-04-28 DIAGNOSIS — I2510 Atherosclerotic heart disease of native coronary artery without angina pectoris: Secondary | ICD-10-CM | POA: Insufficient documentation

## 2020-05-16 ENCOUNTER — Other Ambulatory Visit: Payer: Self-pay | Admitting: Family Medicine

## 2020-05-16 DIAGNOSIS — M5020 Other cervical disc displacement, unspecified cervical region: Secondary | ICD-10-CM

## 2020-05-16 NOTE — Telephone Encounter (Signed)
Requested medication (s) are due for refill today:  Yes  Requested medication (s) are on the active medication list:  Yes  Future visit scheduled:  Yes  Last Refill: 04/16/20/ #120/ no refills  Notes to clinic: not delegated  Requested Prescriptions  Pending Prescriptions Disp Refills   HYDROcodone-acetaminophen (NORCO) 10-325 MG tablet 120 tablet 0    Sig: Take 1 tablet by mouth every 6 (six) hours as needed.      Not Delegated - Analgesics:  Opioid Agonist Combinations Failed - 05/16/2020  4:28 PM      Failed - This refill cannot be delegated      Failed - Urine Drug Screen completed in last 360 days.      Passed - Valid encounter within last 6 months    Recent Outpatient Visits           1 month ago Annual physical exam   [facility] Birdie Sons, MD   5 months ago Muscle spasm of back   [facility] Birdie Sons, MD   6 months ago Type 2 diabetes mellitus without complication, with long-term current use of insulin [facility])   [facility] Birdie Sons, MD   11 months ago Need for influenza vaccination   [facility] Birdie Sons, MD   1 year ago Annual physical exam   [facility] Birdie Sons, MD       Future Appointments             In 4 months Fisher, Kirstie Peri, MD [facility], [place]

## 2020-05-16 NOTE — Telephone Encounter (Signed)
Medication Refill - Medication: HYDROcodone-acetaminophen (NORCO) 10-325 MG tablet    Has the patient contacted their pharmacy? Yes.   (Agent: If no, request that the patient contact the pharmacy for the refill.) (Agent: If yes, when and what did the pharmacy advise?)  Preferred Pharmacy (with phone number or street name):  [id number] - [place], [place] [address]  [address] [place] [address]  Phone: [phone] Fax: [phone]     Agent: Please be advised that RX refills may take up to 3 business days. We ask that you follow-up with your pharmacy.

## 2020-05-20 NOTE — Telephone Encounter (Signed)
Pt called stating that he is completely out of this medication. Please advise.  

## 2020-05-21 MED ORDER — HYDROCODONE-ACETAMINOPHEN 10-325 MG PO TABS: 1.0000 | ORAL_TABLET | Freq: Four times a day (QID) | ORAL | 0 refills | Status: DC | PRN

## 2020-05-21 NOTE — Telephone Encounter (Signed)
Please review request. Patient states he is completely out of medication. Dr. Caryn Section is out of the office until Friday and  Im not sure if Dr. Caryn Section will see this message today.

## 2020-06-06 ENCOUNTER — Other Ambulatory Visit: Payer: Self-pay | Admitting: Family Medicine

## 2020-06-16 ENCOUNTER — Other Ambulatory Visit: Payer: Self-pay | Admitting: Family Medicine

## 2020-06-16 ENCOUNTER — Ambulatory Visit: Payer: BC Managed Care – PPO | Attending: Internal Medicine

## 2020-06-16 DIAGNOSIS — Z23 Encounter for immunization: Secondary | ICD-10-CM

## 2020-06-16 DIAGNOSIS — M5020 Other cervical disc displacement, unspecified cervical region: Secondary | ICD-10-CM

## 2020-06-16 MED ORDER — HYDROCODONE-ACETAMINOPHEN 10-325 MG PO TABS: 1.0000 | ORAL_TABLET | Freq: Four times a day (QID) | ORAL | 0 refills | Status: DC | PRN

## 2020-06-16 NOTE — Addendum Note (Signed)
Addended by: Birdie Sons on: 06/16/2020 05:45 PM   Modules accepted: Orders

## 2020-06-16 NOTE — Progress Notes (Signed)
° °  Covid-19 Vaccination Clinic  Name:  Antonio Howe    MRN: [id number] DOB: 01-06-1961  06/16/2020  [name] was observed post Covid-19 immunization for 15 minutes without incident. He was provided with Vaccine Information Sheet and instruction to access the V-Safe system.   [name] was instructed to call 911 with any severe reactions post vaccine:  Difficulty breathing   Swelling of face and throat   A fast heartbeat   A bad rash all over body   Dizziness and weakness

## 2020-06-16 NOTE — Telephone Encounter (Signed)
Medication Refill - Medication: Hydrocodone  Has the patient contacted their pharmacy? No. (Agent: If no, request that the patient contact the pharmacy for the refill.) (Agent: If yes, when and what did the pharmacy advise?)  Preferred Pharmacy (with phone number or street name):  [id number] - [place], [place] [address] Agent: Please be advised that RX refills may take up to 3 business days. We ask that you follow-up with your pharmacy.

## 2020-06-16 NOTE — Telephone Encounter (Signed)
Requested medication (s) are due for refill today: yes  Requested medication (s) are on the active medication list: yes  Last refill:  05/21/20 #120 0 refills  Future visit scheduled: yes   Notes to clinic:  not delegated per protocol     Requested Prescriptions  Pending Prescriptions Disp Refills   HYDROcodone-acetaminophen (NORCO) 10-325 MG tablet 120 tablet 0    Sig: Take 1 tablet by mouth every 6 (six) hours as needed.      Not Delegated - Analgesics:  Opioid Agonist Combinations Failed - 06/16/2020 11:58 AM      Failed - This refill cannot be delegated      Failed - Urine Drug Screen completed in last 360 days.      Passed - Valid encounter within last 6 months    Recent Outpatient Visits           2 months ago Annual physical exam   [facility] Birdie Sons, MD   6 months ago Muscle spasm of back   [facility] Birdie Sons, MD   7 months ago Type 2 diabetes mellitus without complication, with long-term current use of insulin [facility])   [facility] Birdie Sons, MD   1 year ago Need for influenza vaccination   [facility] Birdie Sons, MD   1 year ago Annual physical exam   [facility] Birdie Sons, MD       Future Appointments             In 3 months Fisher, Kirstie Peri, MD [facility], [place]

## 2020-07-17 ENCOUNTER — Other Ambulatory Visit: Payer: Self-pay | Admitting: Family Medicine

## 2020-07-17 DIAGNOSIS — M5020 Other cervical disc displacement, unspecified cervical region: Secondary | ICD-10-CM

## 2020-07-17 NOTE — Telephone Encounter (Signed)
Requested medication (s) are due for refill today- yes  Requested medication (s) are on the active medication list -yes  Future visit scheduled -yes  Last refill: 06/16/20  Notes to clinic: Request non delegated Rx  Requested Prescriptions  Pending Prescriptions Disp Refills   HYDROcodone-acetaminophen (NORCO) 10-325 MG tablet 120 tablet 0    Sig: Take 1 tablet by mouth every 6 (six) hours as needed.      Not Delegated - Analgesics:  Opioid Agonist Combinations Failed - 07/17/2020  4:22 PM      Failed - This refill cannot be delegated      Failed - Urine Drug Screen completed in last 360 days      Passed - Valid encounter within last 6 months    Recent Outpatient Visits           3 months ago Annual physical exam   [facility] Birdie Sons, MD   7 months ago Muscle spasm of back   [facility] Birdie Sons, MD   8 months ago Type 2 diabetes mellitus without complication, with long-term current use of insulin [facility])   [facility] Birdie Sons, MD   1 year ago Need for influenza vaccination   [facility] Birdie Sons, MD   1 year ago Annual physical exam   [place], [place]       Future Appointments             In 2 months Fisher, Kirstie Peri, MD [facility]                Requested Prescriptions  Pending Prescriptions Disp Refills   HYDROcodone-acetaminophen (NORCO) 10-325 MG tablet 120 tablet 0    Sig: Take 1 tablet by mouth every 6 (six) hours as needed.      Not Delegated - Analgesics:  Opioid Agonist Combinations Failed - 07/17/2020  4:22 PM      Failed - This refill cannot be delegated      Failed - Urine Drug Screen completed in last 360 days      Passed - Valid encounter within last 6 months    Recent Outpatient Visits           3 months ago Annual physical exam   [facility] Birdie Sons, MD   7 months ago  Muscle spasm of back   [facility] Birdie Sons, MD   8 months ago Type 2 diabetes mellitus without complication, with long-term current use of insulin [facility])   [facility] Birdie Sons, MD   1 year ago Need for influenza vaccination   [facility] Birdie Sons, MD   1 year ago Annual physical exam   [facility] Birdie Sons, MD       Future Appointments             In 2 months Fisher, Kirstie Peri, MD [facility], [place]

## 2020-07-17 NOTE — Telephone Encounter (Signed)
Medication Refill - Medication: HYDROcodone-acetaminophen (NORCO) 10-325 MG tablet    Preferred Pharmacy (with phone number or street name):  [id number] - [place], [place] [address] Phone:  [phone]  Fax:  [phone]       Agent: Please be advised that RX refills may take up to 3 business days. We ask that you follow-up with your pharmacy.

## 2020-07-18 ENCOUNTER — Other Ambulatory Visit: Payer: Self-pay | Admitting: Family Medicine

## 2020-07-18 DIAGNOSIS — F39 Unspecified mood [affective] disorder: Secondary | ICD-10-CM

## 2020-07-18 MED ORDER — HYDROCODONE-ACETAMINOPHEN 10-325 MG PO TABS: 1.0000 | ORAL_TABLET | Freq: Four times a day (QID) | ORAL | 0 refills | Status: DC | PRN

## 2020-07-18 NOTE — Telephone Encounter (Signed)
Requested medications are due for refill today yes  Requested medications are on the active medication list yes  Last refill 9/28  Last visit valid visit that addressed this med/dx 10/2019, physical given on 03/2020  Future visit scheduled no  Notes to clinic Not Delegated

## 2020-08-10 ENCOUNTER — Emergency Department
Admission: EM | Admit: 2020-08-10 | Discharge: 2020-08-10 | Disposition: A | Discharge status: Home / Self Care | Payer: BC Managed Care – PPO | Attending: Emergency Medicine | Admitting: Emergency Medicine

## 2020-08-10 ENCOUNTER — Emergency Department: Payer: BC Managed Care – PPO

## 2020-08-10 ENCOUNTER — Other Ambulatory Visit: Payer: Self-pay

## 2020-08-10 DIAGNOSIS — F17210 Nicotine dependence, cigarettes, uncomplicated: Secondary | ICD-10-CM | POA: Insufficient documentation

## 2020-08-10 DIAGNOSIS — I10 Essential (primary) hypertension: Secondary | ICD-10-CM | POA: Diagnosis not present

## 2020-08-10 DIAGNOSIS — Z7982 Long term (current) use of aspirin: Secondary | ICD-10-CM | POA: Diagnosis not present

## 2020-08-10 DIAGNOSIS — K219 Gastro-esophageal reflux disease without esophagitis: Secondary | ICD-10-CM

## 2020-08-10 DIAGNOSIS — E119 Type 2 diabetes mellitus without complications: Secondary | ICD-10-CM | POA: Diagnosis not present

## 2020-08-10 DIAGNOSIS — Z7984 Long term (current) use of oral hypoglycemic drugs: Secondary | ICD-10-CM | POA: Diagnosis not present

## 2020-08-10 DIAGNOSIS — R911 Solitary pulmonary nodule: Secondary | ICD-10-CM | POA: Diagnosis not present

## 2020-08-10 DIAGNOSIS — A059 Bacterial foodborne intoxication, unspecified: Secondary | ICD-10-CM

## 2020-08-10 DIAGNOSIS — R1013 Epigastric pain: Secondary | ICD-10-CM | POA: Diagnosis not present

## 2020-08-10 DIAGNOSIS — R079 Chest pain, unspecified: Secondary | ICD-10-CM | POA: Diagnosis not present

## 2020-08-10 DIAGNOSIS — T6291XA Toxic effect of unspecified noxious substance eaten as food, accidental (unintentional), initial encounter: Secondary | ICD-10-CM | POA: Diagnosis not present

## 2020-08-10 DIAGNOSIS — I499 Cardiac arrhythmia, unspecified: Secondary | ICD-10-CM | POA: Diagnosis not present

## 2020-08-10 LAB — LIPASE, BLOOD: Lipase: 26 U/L (ref 11–51)

## 2020-08-10 LAB — CBC
HCT: 48.1 % (ref 39.0–52.0)
Hemoglobin: 17.3 g/dL — ABNORMAL HIGH (ref 13.0–17.0)
MCH: 33 pg (ref 26.0–34.0)
MCHC: 36 g/dL (ref 30.0–36.0)
MCV: 91.6 fL (ref 80.0–100.0)
Platelets: 255 10*3/uL (ref 150–400)
RBC: 5.25 MIL/uL (ref 4.22–5.81)
RDW: 11.9 % (ref 11.5–15.5)
WBC: 15.2 10*3/uL — ABNORMAL HIGH (ref 4.0–10.5)
nRBC: 0 % (ref 0.0–0.2)

## 2020-08-10 LAB — BASIC METABOLIC PANEL
Anion gap: 12 (ref 5–15)
BUN: 15 mg/dL (ref 8–23)
CO2: 23 mmol/L (ref 22–32)
Calcium: 9.6 mg/dL (ref 8.9–10.3)
Chloride: 97 mmol/L — ABNORMAL LOW (ref 98–111)
Creatinine, Ser: 0.91 mg/dL (ref 0.61–1.24)
GFR, Estimated: >60 mL/min (ref >60)
Glucose, Bld: 244 mg/dL — ABNORMAL HIGH (ref 70–99)
Potassium: 4.7 mmol/L (ref 3.5–5.1)
Sodium: 132 mmol/L — ABNORMAL LOW (ref 135–145)

## 2020-08-10 LAB — HEPATIC FUNCTION PANEL
ALT: 29 U/L (ref 0–44)
AST: 19 U/L (ref 15–41)
Albumin: 4.6 g/dL (ref 3.5–5.0)
Alkaline Phosphatase: 55 U/L (ref 38–126)
Bilirubin, Direct: 0.3 mg/dL — ABNORMAL HIGH (ref 0.0–0.2)
Indirect Bilirubin: 1.8 mg/dL — ABNORMAL HIGH (ref 0.3–0.9)
Total Bilirubin: 2.1 mg/dL — ABNORMAL HIGH (ref 0.3–1.2)
Total Protein: 7.2 g/dL (ref 6.5–8.1)

## 2020-08-10 LAB — TROPONIN I (HIGH SENSITIVITY)
Troponin I (High Sensitivity): 7 ng/L (ref <18)
Troponin I (High Sensitivity): 9 ng/L (ref <18)

## 2020-08-10 MED ORDER — FAMOTIDINE 20 MG PO TABS: 20.0000 mg | ORAL_TABLET | Freq: Two times a day (BID) | ORAL | 0 refills | Status: DC

## 2020-08-10 MED ORDER — METOCLOPRAMIDE HCL 10 MG PO TABS
10.0000 mg | ORAL_TABLET | Freq: Once | ORAL | Status: AC
  Administered 2020-08-10: 10 mg via ORAL
  Filled 2020-08-10: qty 1

## 2020-08-10 MED ORDER — ALUM & MAG HYDROXIDE-SIMETH 200-200-20 MG/5ML PO SUSP
30.0000 mL | Freq: Once | ORAL | Status: AC
  Administered 2020-08-10: 30 mL via ORAL
  Filled 2020-08-10: qty 30

## 2020-08-10 MED ORDER — FAMOTIDINE 20 MG PO TABS
40.0000 mg | ORAL_TABLET | Freq: Once | ORAL | Status: AC
  Administered 2020-08-10: 40 mg via ORAL
  Filled 2020-08-10: qty 2

## 2020-08-10 MED ORDER — ONDANSETRON 4 MG PO TBDP: 4.0000 mg | ORAL_TABLET | Freq: Three times a day (TID) | ORAL | 0 refills | Status: DC | PRN

## 2020-08-10 MED ORDER — METOCLOPRAMIDE HCL 10 MG PO TABS: 10.0000 mg | ORAL_TABLET | Freq: Four times a day (QID) | ORAL | 0 refills | Status: DC | PRN

## 2020-08-10 MED ORDER — ONDANSETRON 4 MG PO TBDP
8.0000 mg | ORAL_TABLET | Freq: Once | ORAL | Status: AC
  Administered 2020-08-10: 8 mg via ORAL
  Filled 2020-08-10: qty 2
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
## 2020-08-10 NOTE — ED Notes (Signed)
EKG completed and signed off by provider

## 2020-08-10 NOTE — ED Notes (Signed)
Pt given gingerale for PO challenge 

## 2020-08-10 NOTE — ED Notes (Signed)
Pt daughter at bedside

## 2020-08-10 NOTE — ED Triage Notes (Signed)
Pt BIBA via [facility] c/o substernal chest pain, radiating to R arm that started at 5am this morning. Pt has hx of hypertension. [facility] states BP PTA was 195/115. [facility] reports that pt's pain is sharp and has tightness. Pt states he has associated nausea and feels like he could vomit.

## 2020-08-10 NOTE — ED Notes (Signed)
ED tech at bedside collecting repeat troponin

## 2020-08-10 NOTE — ED Notes (Signed)
Pt called out stating that he felt like he was going to vomit again. RN to bedside to [facility] pt. Pt got up and walked around the room. Pt burped a few times but did not vomit. Pt was hooked back up to monitor. Pt given warm blankets and head of bed lowered per pt request. Family at bedside with pt. Pt denies any other needs at this time.

## 2020-08-14 ENCOUNTER — Other Ambulatory Visit: Payer: Self-pay | Admitting: Family Medicine

## 2020-08-14 DIAGNOSIS — M5020 Other cervical disc displacement, unspecified cervical region: Secondary | ICD-10-CM

## 2020-08-14 NOTE — Telephone Encounter (Signed)
Medication Refill - Medication: Hydrocodone 10-325  Has the patient contacted their pharmacy? No. (Agent: If no, request that the patient contact the pharmacy for the refill.) (Agent: If yes, when and what did the pharmacy advise?)  Preferred Pharm[facility] (with phone number [facility] street [facility] [place]  Agent: Please be advised that RX refills may take up to 3 business days. We ask that you follow-up with your pharmacy.

## 2020-08-14 NOTE — Telephone Encounter (Signed)
Requested medication (s) are due for refill today: yes  Requested medication (s) are on the active medication list: yes  Last refill:  07/18/20 #120 0 refill  Future visit scheduled: yes  Notes to clinic:  not delegated per protocol     Requested Prescriptions  Pending Prescriptions Disp Refills   HYDROcodone-acetaminophen (NORCO) 10-325 MG tablet 120 tablet 0    Sig: Take 1 tablet by mouth every 6 (six) hours as needed.      Not Delegated - Analgesics:  Opioid Agonist Combinations Failed - 08/14/2020  5:39 PM      Failed - This refill cannot be delegated      Failed - Urine Drug Screen completed in last 360 days      Passed - Valid encounter within last 6 months    Recent Outpatient Visits           4 months ago Annual physical exam   [facility] Birdie Sons, MD   8 months ago Muscle spasm of back   [facility] Birdie Sons, MD   9 months ago Type 2 diabetes mellitus without complication, with long-term current use of insulin [facility])   [facility] Birdie Sons, MD   1 year ago Need for influenza vaccination   [facility] Birdie Sons, MD   1 year ago Annual physical exam   [facility] Birdie Sons, MD       Future Appointments             In 1 month Fisher, Kirstie Peri, MD [facility], [place]

## 2020-08-15 MED ORDER — HYDROCODONE-ACETAMINOPHEN 10-325 MG PO TABS: 1.0000 | ORAL_TABLET | Freq: Four times a day (QID) | ORAL | 0 refills | Status: DC | PRN

## 2020-08-16 ENCOUNTER — Other Ambulatory Visit: Payer: Self-pay | Admitting: Family Medicine

## 2020-09-15 ENCOUNTER — Other Ambulatory Visit: Payer: Self-pay | Admitting: Family Medicine

## 2020-09-15 DIAGNOSIS — M5020 Other cervical disc displacement, unspecified cervical region: Secondary | ICD-10-CM

## 2020-09-15 MED ORDER — HYDROCODONE-ACETAMINOPHEN 10-325 MG PO TABS: 1.0000 | ORAL_TABLET | Freq: Four times a day (QID) | ORAL | 0 refills | Status: DC | PRN

## 2020-09-15 NOTE — Telephone Encounter (Signed)
Pt request refill  HYDROcodone-acetaminophen (NORCO) 10-325 MG tablet  [id number] [place], [place] - [address]

## 2020-09-15 NOTE — Telephone Encounter (Signed)
Requested medication (s) are due for refill today - yes  Requested medication (s) are on the active medication list -yes  Future visit scheduled -yes  Last refill: 08/15/20  Notes to clinic: Request non delegated Rx  Requested Prescriptions  Pending Prescriptions Disp Refills   HYDROcodone-acetaminophen (NORCO) 10-325 MG tablet 120 tablet 0    Sig: Take 1 tablet by mouth every 6 (six) hours as needed.      Not Delegated - Analgesics:  Opioid Agonist Combinations Failed - 09/15/2020  1:34 PM      Failed - This refill cannot be delegated      Failed - Urine Drug Screen completed in last 360 days      Passed - Valid encounter within last 6 months    Recent Outpatient Visits           5 months ago Annual physical exam   [facility] Malva Limes, MD   9 months ago Muscle spasm of back   [facility] Malva Limes, MD   10 months ago Type 2 diabetes mellitus without complication, with long-term current use of insulin [facility])   [facility] Malva Limes, MD   1 year ago Need for influenza vaccination   [facility] Malva Limes, MD   1 year ago Annual physical exam   [facility] Malva Limes, MD       Future Appointments             In 3 weeks Fisher, Demetrios Isaacs, MD [facility]                 Requested Prescriptions  Pending Prescriptions Disp Refills   HYDROcodone-acetaminophen (NORCO) 10-325 MG tablet 120 tablet 0    Sig: Take 1 tablet by mouth every 6 (six) hours as needed.      Not Delegated - Analgesics:  Opioid Agonist Combinations Failed - 09/15/2020  1:34 PM      Failed - This refill cannot be delegated      Failed - Urine Drug Screen completed in last 360 days      Passed - Valid encounter within last 6 months    Recent Outpatient Visits           5 months ago Annual physical exam   [facility] Malva Limes, MD   9 months ago  Muscle spasm of back   [facility] Malva Limes, MD   10 months ago Type 2 diabetes mellitus without complication, with long-term current use of insulin [facility])   [facility] Malva Limes, MD   1 year ago Need for influenza vaccination   [facility] Malva Limes, MD   1 year ago Annual physical exam   [facility] Malva Limes, MD       Future Appointments             In 3 weeks Fisher, Demetrios Isaacs, MD [facility]

## 2020-10-01 DIAGNOSIS — L570 Actinic keratosis: Secondary | ICD-10-CM | POA: Diagnosis not present

## 2020-10-10 ENCOUNTER — Encounter: Payer: Self-pay | Admitting: Family Medicine

## 2020-10-10 ENCOUNTER — Other Ambulatory Visit: Payer: Self-pay

## 2020-10-10 ENCOUNTER — Ambulatory Visit: Payer: BC Managed Care – PPO | Admitting: Family Medicine

## 2020-10-10 VITALS — BP 129/72 | HR 82 | Temp 98.8°F | Resp 18 | Wt 165.0 lb

## 2020-10-10 DIAGNOSIS — I10 Essential (primary) hypertension: Secondary | ICD-10-CM

## 2020-10-10 DIAGNOSIS — Z794 Long term (current) use of insulin: Secondary | ICD-10-CM

## 2020-10-10 DIAGNOSIS — E119 Type 2 diabetes mellitus without complications: Secondary | ICD-10-CM | POA: Diagnosis not present

## 2020-10-10 DIAGNOSIS — F17210 Nicotine dependence, cigarettes, uncomplicated: Secondary | ICD-10-CM | POA: Diagnosis not present

## 2020-10-10 LAB — POCT GLYCOSYLATED HEMOGLOBIN (HGB A1C)
Est. average glucose Bld gHb Est-mCnc: 120
Hemoglobin A1C: 5.8 % — AB (ref 4.0–5.6)

## 2020-10-10 NOTE — Patient Instructions (Addendum)
   Please stop smoking  . Please bring all of your medications to every appointment so we can make sure that our medication list is the same as yours.   . Please contact your eyecare professional to schedule a routine eye exam

## 2020-10-10 NOTE — Progress Notes (Signed)
Established patient visit   Patient: Antonio Howe   DOB: 13-Dec-1960   60 y.o. Male  MRN: [id number] Visit Date: 10/10/2020  Today's healthcare provider: Lelon Huh, MD   Chief Complaint  Patient presents with  . Diabetes  . Hypertension   Subjective    HPI  Diabetes Mellitus Type II, Follow-up  Lab Results  Component Value Date   HGBA1C 5.8 (A) 10/10/2020   HGBA1C 5.9 (H) 04/02/2020   HGBA1C 5.9 (A) 10/29/2019   Wt Readings from Last 3 Encounters:  10/10/20 165 lb (74.8 kg)  08/10/20 175 lb (79.4 kg)  04/23/20 180 lb (81.6 kg)   Last seen for diabetes 6 months ago.  Management since then includes continue same medocation. He reports good compliance with treatment. He is not having side effects.  Symptoms: No fatigue No foot ulcerations  No appetite changes No nausea  No paresthesia of the feet  No polydipsia  No polyuria No visual disturbances   No vomiting     Home blood sugar records: blood sugars are not checked  Episodes of hypoglycemia? No    Current insulin regiment: none Most Recent Eye Exam: >1 year ago Current exercise: none Current diet habits: well balanced  Pertinent Labs: Lab Results  Component Value Date   CHOL 149 04/02/2020   HDL 56 04/02/2020   LDLCALC 79 04/02/2020   TRIG 73 04/02/2020   CHOLHDL 2.7 04/02/2020   Lab Results  Component Value Date   NA 132 (L) 08/10/2020   K 4.7 08/10/2020   CREATININE 0.91 08/10/2020   GFRNONAA >60 08/10/2020   GFRAA 109 04/02/2020   GLUCOSE 244 (H) 08/10/2020     ---------------------------------------------------------------------------------------------------  Hypertension, follow-up  BP Readings from Last 3 Encounters:  10/10/20 129/72  08/10/20 (!) 157/96  03/31/20 (!) 159/84   Wt Readings from Last 3 Encounters:  10/10/20 165 lb (74.8 kg)  08/10/20 175 lb (79.4 kg)  04/23/20 180 lb (81.6 kg)     He was last seen for hypertension 6 months ago.  BP at that visit was  159/84. Management since that visit includes continue same medication.  He reports good compliance with treatment. He is not having side effects.  He is following a Regular diet. He is not exercising. He does smoke.  Use of agents associated with hypertension: NSAIDS.   Outside blood pressures are not checked. Symptoms: No chest pain No chest pressure  No palpitations No syncope  No dyspnea No orthopnea  No paroxysmal nocturnal dyspnea No lower extremity edema   Pertinent labs: Lab Results  Component Value Date   CHOL 149 04/02/2020   HDL 56 04/02/2020   LDLCALC 79 04/02/2020   TRIG 73 04/02/2020   CHOLHDL 2.7 04/02/2020   Lab Results  Component Value Date   NA 132 (L) 08/10/2020   K 4.7 08/10/2020   CREATININE 0.91 08/10/2020   GFRNONAA >60 08/10/2020   GFRAA 109 04/02/2020   GLUCOSE 244 (H) 08/10/2020     The 10-year ASCVD risk score Mikey Bussing DC Jr., et al., 2013) is: 22.3%   ---------------------------------------------------------------------------------------------------     Medications: Outpatient Medications Prior to Visit  Medication Sig  . aspirin 81 MG tablet Take 1 tablet by mouth daily.  . B COMPLEX VITAMINS PO Take 1 capsule by mouth daily.  . diazepam (VALIUM) 5 MG tablet TAKE 2 TABLETS BY MOUTH ONCE DAILY IF NEEDED  . glipiZIDE (GLUCOTROL XL) 2.5 MG 24 hr tablet TAKE 1 TABLET BY  MOUTH EVERY DAY  . HYDROcodone-acetaminophen (NORCO) 10-325 MG tablet Take 1 tablet by mouth every 6 (six) hours as needed.  Marland Kitchen lisinopril-hydrochlorothiazide (ZESTORETIC) 10-12.5 MG tablet TAKE 1 TABLET BY MOUTH EVERY DAY  . glucose blood test strip 1 strip by Does not apply route daily. (Patient not taking: Reported on 10/10/2020)  . metoCLOPramide (REGLAN) 10 MG tablet Take 1 tablet (10 mg total) by mouth every 6 (six) hours as needed. (Patient not taking: Reported on 10/10/2020)  . [DISCONTINUED] cyclobenzaprine (FLEXERIL) 5 MG tablet Take 1 tablet (5 mg total) by mouth 3  (three) times daily as needed for muscle spasms. (Patient not taking: Reported on 10/10/2020)  . [DISCONTINUED] famotidine (PEPCID) 20 MG tablet Take 1 tablet (20 mg total) by mouth 2 (two) times daily. (Patient not taking: Reported on 10/10/2020)  . [DISCONTINUED] ondansetron (ZOFRAN ODT) 4 MG disintegrating tablet Take 1 tablet (4 mg total) by mouth every 8 (eight) hours as needed for nausea or vomiting. (Patient not taking: Reported on 10/10/2020)   No facility-administered medications prior to visit.    Review of Systems  Constitutional: Negative for appetite change, chills and fever.  Respiratory: Negative for chest tightness, shortness of breath and wheezing.   Cardiovascular: Negative for chest pain and palpitations.  Gastrointestinal: Negative for abdominal pain, nausea and vomiting.       Objective    BP 129/72 (BP Location: Left Arm, Patient Position: Sitting, Cuff Size: Normal)   Pulse 82   Temp 98.8 F (37.1 C) (Temporal)   Resp 18   Wt 165 lb (74.8 kg)   BMI 25.84 kg/m     Physical Exam   General appearance:  Overweight male, cooperative and in no acute distress Head: Normocephalic, without obvious abnormality, atraumatic Respiratory: Respirations even and unlabored, normal respiratory rate Extremities: All extremities are intact.  Skin: Skin color, texture, turgor normal. No rashes seen  Psych: Appropriate mood and affect. Neurologic: Mental status: Alert, oriented to person, place, and time, thought content appropriate.   Results for orders placed or performed in visit on 10/10/20  POCT HgB A1C  Result Value Ref Range   Hemoglobin A1C 5.8 (A) 4.0 - 5.6 %   Est. average glucose Bld gHb Est-mCnc 120     Assessment & Plan     1. Type 2 diabetes mellitus without complication, with long-term current use of insulin (HCC) Very well controlled. Overdue for diabetic eye exam.  - Ambulatory referral to Optometry  2. Smoking greater than 30 pack years He is  planning on stopping smoking in the near future.   3. Essential (primary) hypertension Well controlled.  Continue current medications.    Follow up and CPE in 6 months.         The entirety of the information documented in the History of Present Illness, Review of Systems and Physical Exam were personally obtained by me. Portions of this information were initially documented by the CMA and reviewed by me for thoroughness and accuracy.      Lelon Huh, MD  [facility] [phone] (phone) [phone] (fax)  [place]

## 2020-10-20 ENCOUNTER — Other Ambulatory Visit: Payer: Self-pay | Admitting: Family Medicine

## 2020-10-20 DIAGNOSIS — M5020 Other cervical disc displacement, unspecified cervical region: Secondary | ICD-10-CM

## 2020-10-20 NOTE — Telephone Encounter (Signed)
Requested medication (s) are due for refill today: yes  Requested medication (s) are on the active medication list: yes  Last refill:  09/15/2020  Future visit scheduled: yes   Notes to clinic: this refill cannot be delegated   Requested Prescriptions  Pending Prescriptions Disp Refills   HYDROcodone-acetaminophen (NORCO) 10-325 MG tablet 120 tablet 0    Sig: Take 1 tablet by mouth every 6 (six) hours as needed.      There is no refill protocol information for this order

## 2020-10-20 NOTE — Telephone Encounter (Signed)
Medication Refill - Medication: HYDROcodone-acetaminophen (NORCO) 10-325 MG tablet    Has the patient contacted their pharmacy? No. (Agent: If no, request that the patient contact the pharmacy for the refill.) (Agent: If yes, when and what did the pharmacy advise?)  Preferred Pharmacy (with phone number [facility] street name[facility] [place]   Agent: Please be advised that RX refills may take up to 3 business days. We ask that you follow-up with your pharmacy.

## 2020-10-21 MED ORDER — HYDROCODONE-ACETAMINOPHEN 10-325 MG PO TABS: 1.0000 | ORAL_TABLET | Freq: Four times a day (QID) | ORAL | 0 refills | Status: DC | PRN

## 2020-10-31 DIAGNOSIS — L570 Actinic keratosis: Secondary | ICD-10-CM | POA: Diagnosis not present

## 2020-10-31 DIAGNOSIS — L578 Other skin changes due to chronic exposure to nonionizing radiation: Secondary | ICD-10-CM | POA: Diagnosis not present

## 2020-10-31 DIAGNOSIS — D225 Melanocytic nevi of trunk: Secondary | ICD-10-CM | POA: Diagnosis not present

## 2020-11-04 ENCOUNTER — Other Ambulatory Visit: Payer: Self-pay | Admitting: Family Medicine

## 2020-11-14 ENCOUNTER — Other Ambulatory Visit: Payer: Self-pay | Admitting: Family Medicine

## 2020-11-14 DIAGNOSIS — M5020 Other cervical disc displacement, unspecified cervical region: Secondary | ICD-10-CM

## 2020-11-14 NOTE — Telephone Encounter (Signed)
Requested medication (s) are due for refill today: yes  Requested medication (s) are on the active medication list: yes  Last refill:  10/21/20 #120 0 refills  Future visit scheduled: yes  Notes to clinic:  not delegated per protocol     Requested Prescriptions  Pending Prescriptions Disp Refills   HYDROcodone-acetaminophen (NORCO) 10-325 MG tablet 120 tablet 0    Sig: Take 1 tablet by mouth every 6 (six) hours as needed.      Not Delegated - Analgesics:  Opioid Agonist Combinations Failed - 11/14/2020  1:37 PM      Failed - This refill cannot be delegated      Failed - Urine Drug Screen completed in last 360 days      Passed - Valid encounter within last 6 months    Recent Outpatient Visits           1 month ago Type 2 diabetes mellitus without complication, with long-term current use of insulin [facility])   [facility] Birdie Sons, MD   7 months ago Annual physical exam   [facility] Birdie Sons, MD   11 months ago Muscle spasm of back   [facility] Birdie Sons, MD   1 year ago Type 2 diabetes mellitus without complication, with long-term current use of insulin [facility])   [facility] Birdie Sons, MD   1 year ago Need for influenza vaccination   [facility] Birdie Sons, MD       Future Appointments             In 4 months Fisher, Kirstie Peri, MD [facility], [place]

## 2020-11-14 NOTE — Telephone Encounter (Signed)
Please review. Thanks!  

## 2020-11-14 NOTE — Telephone Encounter (Signed)
Medication Refill - Medication: HYDROcodone-acetaminophen (NORCO) 10-325 MG tablet    Has the patient contacted their pharmacy? Yes.    (Agent: If yes, when and what did the pharmacy advise?) Contact PCP office   Preferred Pharmacy (with phone number or street name):  [id number] [place] [place] [address] Phone:  [phone]  Fax:  [phone]       Agent: Please be advised that RX refills may take up to 3 business days. We ask that you follow-up with your pharmacy.

## 2020-11-14 NOTE — Telephone Encounter (Signed)
30 day supply dispensed 10-21-2020

## 2020-11-17 MED ORDER — HYDROCODONE-ACETAMINOPHEN 10-325 MG PO TABS: 1.0000 | ORAL_TABLET | Freq: Four times a day (QID) | ORAL | 0 refills | Status: DC | PRN

## 2020-12-02 DIAGNOSIS — E119 Type 2 diabetes mellitus without complications: Secondary | ICD-10-CM | POA: Diagnosis not present

## 2020-12-02 DIAGNOSIS — H2513 Age-related nuclear cataract, bilateral: Secondary | ICD-10-CM | POA: Diagnosis not present

## 2020-12-02 DIAGNOSIS — Z7984 Long term (current) use of oral hypoglycemic drugs: Secondary | ICD-10-CM | POA: Diagnosis not present

## 2020-12-02 DIAGNOSIS — H5203 Hypermetropia, bilateral: Secondary | ICD-10-CM | POA: Diagnosis not present

## 2020-12-02 LAB — HM DIABETES EYE EXAM

## 2020-12-16 ENCOUNTER — Ambulatory Visit: Payer: Self-pay | Admitting: *Deleted

## 2020-12-16 ENCOUNTER — Other Ambulatory Visit: Payer: Self-pay | Admitting: Family Medicine

## 2020-12-16 DIAGNOSIS — M5020 Other cervical disc displacement, unspecified cervical region: Secondary | ICD-10-CM

## 2020-12-16 MED ORDER — HYDROCODONE-ACETAMINOPHEN 10-325 MG PO TABS: 1.0000 | ORAL_TABLET | Freq: Four times a day (QID) | ORAL | 0 refills | Status: DC | PRN

## 2020-12-16 NOTE — Telephone Encounter (Signed)
Spoke with [place] regarding scheduling pt; there is availability in the office until 12/26/20; she states the pt should be evaluated at Urgent Care in the interim; pt notified and says he would like to schedule his appt on 12/26/20; pt advised to be evaluated at Urgent Care or ED if his symptoms worsen; decision tree completed; pt offered and accept appt with Dr Caryn Section 12/26/20 at 1040; will route to office for notification.

## 2020-12-16 NOTE — Telephone Encounter (Signed)
Pt called with complaints of lower back pain since the end of 2021 and hand numbness R > L; for the past 6 weeks;  he pt says he was previously given a muscle relaxer; the pt describes his back discomfort "soreness" that extends to his buttocks and is mild to moderate; his hand numbness is has tingling in fingers and it affects his grip; the pt says he has been doing toe-touches for the past 4 days and it has helped; recommendations made per nurse triage protocol; he verbalized understanding; decision tree completed; he sees Dr Caryn Section, [facility], but this provider has no availability within the timeframe per protocol; the pt says he would like to be seen prior to April 13; the pt can be contacted at [phone].  Reason for Disposition . [1] Pain radiates into the thigh or further down the leg AND [2] one leg  Answer Assessment - Initial Assessment Questions 1. ONSET: "When did the pain begin?"     Since the end of 2021 2. LOCATION: "Where does it hurt?" (upper, mid or lower back)    Lower back 3. SEVERITY: "How bad is the pain?"  (e.g., Scale 1-10; mild, moderate, or severe)   - MILD (1-3): doesn't interfere with normal activities    - MODERATE (4-7): interferes with normal activities or awakens from sleep    - SEVERE (8-10): excruciating pain, unable to do any normal activities     mild 4. PATTERN: "Is the pain constant?" (e.g., yes, no; constant, intermittent)   intermittent 5. RADIATION: "Does the pain shoot into your legs or elsewhere?"     Radiates to buttocks 6. CAUSE:  "What do you think is causing the back pain?"       7. BACK OVERUSE:  "Any recent lifting of heavy objects, strenuous work or exercise?"    no 8. MEDICATIONS: "What have you taken so far for the pain?" (e.g., nothing, acetaminophen, NSAIDS)    Previously given muscle relaxers 9. NEUROLOGIC SYMPTOMS: "Do you have any weakness, numbness, or problems with bowel/bladder control?"     R>L hand numbness for the  past 6 weeks 10. OTHER SYMPTOMS: "Do you have any other symptoms?" (e.g., fever, abdominal pain, burning with urination, blood in urine)       no 11. PREGNANCY: "Is there any chance you are pregnant?" (e.g., yes, no; LMP)      n/a  Protocols used: BACK PAIN-A-AH

## 2020-12-16 NOTE — Telephone Encounter (Signed)
Medication Refill - Medication: HYDROcodone-acetaminophen (NORCO) 10-325 MG tablet    Has the patient contacted their pharmacy? No. (Agent: If no, request that the patient contact the pharmacy for the refill.) (Agent: If yes, when and what did the pharmacy advise?)  Preferred Pharmacy (with phone number or street name):  [id number] [place] [place] [address]  [address] [place] [address]  Phone: [phone] Fax: [phone]     Agent: Please be advised that RX refills may take up to 3 business days. We ask that you follow-up with your pharmacy.

## 2020-12-16 NOTE — Telephone Encounter (Signed)
Requested medication (s) are due for refill today: yes  Requested medication (s) are on the active medication list: yes  Last refill: 11/17/2020  Future visit scheduled: yes  Notes to clinic:  this refill cannot be delegated    Requested Prescriptions  Pending Prescriptions Disp Refills   HYDROcodone-acetaminophen (NORCO) 10-325 MG tablet 120 tablet 0    Sig: Take 1 tablet by mouth every 6 (six) hours as needed.      Not Delegated - Analgesics:  Opioid Agonist Combinations Failed - 12/16/2020  1:57 PM      Failed - This refill cannot be delegated      Failed - Urine Drug Screen completed in last 360 days      Passed - Valid encounter within last 6 months    Recent Outpatient Visits           2 months ago Type 2 diabetes mellitus without complication, with long-term current use of insulin [facility])   [facility] Birdie Sons, MD   8 months ago Annual physical exam   [facility] Birdie Sons, MD   1 year ago Muscle spasm of back   [facility] Birdie Sons, MD   1 year ago Type 2 diabetes mellitus without complication, with long-term current use of insulin [facility])   [facility] Birdie Sons, MD   1 year ago Need for influenza vaccination   [facility] Birdie Sons, MD       Future Appointments             In 3 months Fisher, Kirstie Peri, MD [facility], [place]

## 2020-12-17 ENCOUNTER — Other Ambulatory Visit: Payer: Self-pay | Admitting: Family Medicine

## 2020-12-17 DIAGNOSIS — F39 Unspecified mood [affective] disorder: Secondary | ICD-10-CM

## 2020-12-26 ENCOUNTER — Encounter: Payer: Self-pay | Admitting: Family Medicine

## 2020-12-26 ENCOUNTER — Ambulatory Visit (INDEPENDENT_AMBULATORY_CARE_PROVIDER_SITE_OTHER): Payer: BC Managed Care – PPO | Admitting: Family Medicine

## 2020-12-26 ENCOUNTER — Other Ambulatory Visit: Payer: Self-pay

## 2020-12-26 VITALS — BP 136/79 | HR 99 | Temp 97.7°F | Resp 16 | Wt 171.6 lb

## 2020-12-26 DIAGNOSIS — M5412 Radiculopathy, cervical region: Secondary | ICD-10-CM | POA: Diagnosis not present

## 2020-12-26 DIAGNOSIS — R454 Irritability and anger: Secondary | ICD-10-CM

## 2020-12-26 DIAGNOSIS — R200 Anesthesia of skin: Secondary | ICD-10-CM

## 2020-12-26 DIAGNOSIS — R202 Paresthesia of skin: Secondary | ICD-10-CM

## 2020-12-26 DIAGNOSIS — G4710 Hypersomnia, unspecified: Secondary | ICD-10-CM | POA: Diagnosis not present

## 2020-12-26 DIAGNOSIS — R5383 Other fatigue: Secondary | ICD-10-CM

## 2020-12-26 MED ORDER — SERTRALINE HCL 25 MG PO TABS: ORAL_TABLET | ORAL | 1 refills | Status: DC

## 2020-12-26 MED ORDER — MELOXICAM 15 MG PO TABS: 15.0000 mg | ORAL_TABLET | Freq: Every day | ORAL | 0 refills | Status: DC

## 2020-12-26 NOTE — Progress Notes (Signed)
Established patient visit   Patient: Antonio Howe   DOB: 05-Oct-1960   60 y.o. Male  MRN: [id number] Visit Date: 12/26/2020  Today's healthcare provider: Lelon Huh, MD   Chief Complaint  Patient presents with  . Back Pain   Subjective    Back Pain This is a new problem. Episode onset: 4 months ago. The problem occurs intermittently. The problem has been waxing and waning since onset. The pain is present in the lumbar spine. The quality of the pain is described as aching (soreness). Radiates to: buttocks. Exacerbated by: nothing. Associated symptoms include numbness (tingling in fingers x 2 months: worse in right hand) and tingling. Pertinent negatives include no abdominal pain, chest pain, fever or headaches. He has tried home exercises (also Hydrocodone- Acetaminophen) for the symptoms. The treatment provided moderate relief.    However, his primary complaint today is irritability particularly at work. He states he enjoys his work, but has been increasingly frustrated and irritated by management. He states he shouldn't need to get so irritated but can't himself. He has also been increasingly fatigued, loss of ambition, not remembering things as well, and often just wants to sleep. He states he feels like he sleeps soundly, but also feels sleepy during the day. He has never had a sleep study.      Medications: Outpatient Medications Prior to Visit  Medication Sig  . aspirin 81 MG tablet Take 1 tablet by mouth daily.  . B COMPLEX VITAMINS PO Take 1 capsule by mouth daily.  . diazepam (VALIUM) 5 MG tablet TAKE 2 TABLETS BY MOUTH ONCE DAILY IF NEEDED  . glipiZIDE (GLUCOTROL XL) 2.5 MG 24 hr tablet TAKE 1 TABLET BY MOUTH EVERY DAY  . glucose blood test strip 1 strip by Does not apply route daily.  Marland Kitchen HYDROcodone-acetaminophen (NORCO) 10-325 MG tablet Take 1 tablet by mouth every 6 (six) hours as needed.  Marland Kitchen lisinopril-hydrochlorothiazide (ZESTORETIC) 10-12.5 MG tablet TAKE 1  TABLET BY MOUTH EVERY DAY  . metoCLOPramide (REGLAN) 10 MG tablet Take 1 tablet (10 mg total) by mouth every 6 (six) hours as needed. (Patient not taking: Reported on 12/26/2020)   No facility-administered medications prior to visit.    Review of Systems  Constitutional: Negative for appetite change, chills, fatigue and fever.  Respiratory: Negative for chest tightness, shortness of breath and wheezing.   Cardiovascular: Negative for chest pain and palpitations.  Gastrointestinal: Negative for abdominal pain, nausea and vomiting.  Musculoskeletal: Positive for back pain.  Neurological: Positive for tingling and numbness (tingling in fingers x 2 months: worse in right hand). Negative for headaches.  Psychiatric/Behavioral: Positive for dysphoric mood.       Objective    BP 136/79 (BP Location: Left Arm, Patient Position: Sitting, Cuff Size: Normal)   Pulse 99   Temp 97.7 F (36.5 C) (Temporal)   Resp 16   Wt 171 lb 9.6 oz (77.8 kg)   BMI 26.88 kg/m     Physical Exam   General appearance:  Well developed, well nourished male, cooperative and in no acute distress Head: Normocephalic, without obvious abnormality, atraumatic Respiratory: Respirations even and unlabored, normal respiratory rate Extremities: All extremities are intact.  Skin: Skin color, texture, turgor normal. No rashes seen  Psych: Appropriate mood and affect. Neurologic: Mental status: Alert, oriented to person, place, and time, thought content appropriate.  Negative tinel's sign, negative phalen's sign. No wrist tenderness.   No results found for any visits on 12/26/20.  Assessment & Plan     1. Irritability  - sertraline (ZOLOFT) 25 MG tablet; Take 1/2 tablet daily for six days, then increase to 1 tablet daily for one week, then increase to two tablets daily  Dispense: 30 tablet; Refill: 1 - Ambulatory referral to Sleep Studies  2. Hypersomnia  - Ambulatory referral to Sleep Studies  3. Cervical  radiculopathy S/p cervical discectomy  4. Numbness and tingling in both hands Likely secondary to cervical disk disease.  - meloxicam (MOBIC) 15 MG tablet; Take 1 tablet (15 mg total) by mouth daily.  Dispense: 30 tablet; Refill: 0     5. Other fatigue  - Ambulatory referral to Sleep Studies  Follow up on sertraline one month.      The entirety of the information documented in the History of Present Illness, Review of Systems and Physical Exam were personally obtained by me. Portions of this information were initially documented by the CMA and reviewed by me for thoroughness and accuracy.      Lelon Huh, MD  [facility] [phone] (phone) [phone] (fax)  [place]

## 2021-01-03 ENCOUNTER — Other Ambulatory Visit: Payer: Self-pay | Admitting: Family Medicine

## 2021-01-03 DIAGNOSIS — R454 Irritability and anger: Secondary | ICD-10-CM

## 2021-01-20 ENCOUNTER — Other Ambulatory Visit: Payer: Self-pay | Admitting: Family Medicine

## 2021-01-20 DIAGNOSIS — M5020 Other cervical disc displacement, unspecified cervical region: Secondary | ICD-10-CM

## 2021-01-20 DIAGNOSIS — R454 Irritability and anger: Secondary | ICD-10-CM

## 2021-01-20 MED ORDER — HYDROCODONE-ACETAMINOPHEN 10-325 MG PO TABS: 1.0000 | ORAL_TABLET | Freq: Four times a day (QID) | ORAL | 0 refills | Status: DC | PRN

## 2021-01-20 MED ORDER — MELOXICAM 15 MG PO TABS: 15.0000 mg | ORAL_TABLET | Freq: Every day | ORAL | 1 refills | Status: DC | PRN

## 2021-01-20 NOTE — Telephone Encounter (Signed)
Requested medication (s) are due for refill today: yes  Requested medication (s) are on the active medication list: yes  Last refill: 12/16/2020  Future visit scheduled:  yes   Notes to clinic:  Patient is due to follow up on Sertraline/ Patient was to increase to 2 tabs  This refill cannot be delegated   Requested Prescriptions  Pending Prescriptions Disp Refills   HYDROcodone-acetaminophen (NORCO) 10-325 MG tablet 120 tablet 0    Sig: Take 1 tablet by mouth every 6 (six) hours as needed.      Not Delegated - Analgesics:  Opioid Agonist Combinations Failed - 01/20/2021  1:18 PM      Failed - This refill cannot be delegated      Failed - Urine Drug Screen completed in last 360 days      Passed - Valid encounter within last 6 months    Recent Outpatient Visits           3 weeks ago [place], Donald E, MD   3 months ago Type 2 diabetes mellitus without complication, with long-term current use of insulin [facility])   [facility] Birdie Sons, MD   9 months ago Annual physical exam   [facility] Birdie Sons, MD   1 year ago Muscle spasm of back   [facility] Birdie Sons, MD   1 year ago Type 2 diabetes mellitus without complication, with long-term current use of insulin [facility])   [facility] Birdie Sons, MD       Future Appointments             In 4 weeks Fisher, Kirstie Peri, MD [facility]   In 2 months Fisher, Kirstie Peri, MD [facility]               sertraline (ZOLOFT) 25 MG tablet 30 tablet 1    Sig: Take 1/2 tablet daily for six days, then increase to 1 tablet daily for one week, then increase to two tablets daily      Psychiatry:  Antidepressants - SSRI Passed - 01/20/2021  1:18 PM      Passed - Valid encounter within last 6 months    Recent Outpatient Visits           3 weeks ago [place], Donald E, MD   3 months ago Type 2 diabetes mellitus without complication, with long-term current use of insulin ([place])   [facility] Birdie Sons, MD   9 months ago Annual physical exam   [facility] Birdie Sons, MD   1 year ago Muscle spasm of back   [facility] Birdie Sons, MD   1 year ago Type 2 diabetes mellitus without complication, with long-term current use of insulin [facility])   [facility] Birdie Sons, MD       Future Appointments             In 4 weeks Caryn Section, Kirstie Peri, MD [facility]   In 2 months Fisher, Kirstie Peri, MD [facility]               meloxicam (MOBIC) 15 MG tablet 30 tablet 0    Sig: Take 1 tablet (15 mg total) by mouth daily.      Analgesics:  COX2 Inhibitors Failed - 01/20/2021  1:18 PM      Failed - HGB in normal range and within 360 days    Hemoglobin  Date Value Ref Range Status  08/10/2020 17.3 (H) 13.0 - 17.0 g/dL Final  04/02/2020 16.6 13.0 - 17.7 g/dL Final          Passed - Cr in normal range and within 360 days    Creatinine, Ser  Date Value Ref Range Status  08/10/2020 0.91 0.61 - 1.24 mg/dL Final   Creatinine, POC  Date Value Ref Range Status  07/04/2017 n/a mg/dL Final          Passed - Patient is not pregnant      Passed - Valid encounter within last 12 months    Recent Outpatient Visits           3 weeks ago [place], Donald E, MD   3 months ago Type 2 diabetes mellitus without complication, with long-term current use of insulin [facility])   [facility] Birdie Sons, MD   9 months ago Annual physical exam   [facility] Birdie Sons, MD   1 year ago Muscle spasm of back   [facility] Birdie Sons, MD   1 year ago Type 2 diabetes mellitus without complication, with long-term current use of insulin  [facility])   [facility] Birdie Sons, MD       Future Appointments             In 4 weeks Fisher, Kirstie Peri, MD [facility], [facility]   In 2 months Fisher, Kirstie Peri, MD [facility] [place]

## 2021-01-20 NOTE — Telephone Encounter (Signed)
Medication Refill - Medication: Hydrocodone, meloxicam, sertraline   Has the patient contacted their pharmacy? Yes.  Pt states that he always has to call in his prescriptions through office. Please advise.  (Agent: If no, request that the patient contact the pharmacy for the refill.) (Agent: If yes, when and what did the pharmacy advise?)  Preferred Pharmacy (with phone number or street name):  [id number] [place], [address]  [address] [place] [address]  Phone: [phone] Fax: [phone]  Hours: Not open 24 hours    Agent: Please be advised that RX refills may take up to 3 business days. We ask that you follow-up with your pharmacy.

## 2021-01-21 MED ORDER — SERTRALINE HCL 50 MG PO TABS: 50.0000 mg | ORAL_TABLET | Freq: Every day | ORAL | 1 refills | Status: DC

## 2021-02-13 ENCOUNTER — Other Ambulatory Visit: Payer: Self-pay | Admitting: Family Medicine

## 2021-02-14 ENCOUNTER — Other Ambulatory Visit: Payer: Self-pay | Admitting: Family Medicine

## 2021-02-14 DIAGNOSIS — R454 Irritability and anger: Secondary | ICD-10-CM

## 2021-02-16 ENCOUNTER — Other Ambulatory Visit: Payer: Self-pay | Admitting: Family Medicine

## 2021-02-16 DIAGNOSIS — M5020 Other cervical disc displacement, unspecified cervical region: Secondary | ICD-10-CM

## 2021-02-16 MED ORDER — HYDROCODONE-ACETAMINOPHEN 10-325 MG PO TABS: 1.0000 | ORAL_TABLET | Freq: Four times a day (QID) | ORAL | 0 refills | Status: DC | PRN

## 2021-02-16 NOTE — Telephone Encounter (Signed)
Requested medication (s) are due for refill today:  yes  Requested medication (s) are on the active medication list: yes   Last refill:  01/20/2021  Future visit scheduled: yes   Notes to clinic:  this refill cannot be delegated    Requested Prescriptions  Pending Prescriptions Disp Refills   HYDROcodone-acetaminophen (NORCO) 10-325 MG tablet 120 tablet 0    Sig: Take 1 tablet by mouth every 6 (six) hours as needed.      There is no refill protocol information for this order

## 2021-02-16 NOTE — Telephone Encounter (Signed)
Medication Refill - Medication: hydrocodone 10-325 mg  Has the patient contacted their pharmacy? Yes told to call md office Preferred Pharmacy (with phone number or street name): [facility] [address] in [place] phone number [phone]  Agent: Please be advised that RX refills may take up to 3 business days. We ask that you follow-up with your pharmacy.

## 2021-02-17 ENCOUNTER — Encounter: Payer: Self-pay | Admitting: Family Medicine

## 2021-02-17 ENCOUNTER — Ambulatory Visit: Payer: BC Managed Care – PPO | Admitting: Family Medicine

## 2021-02-17 ENCOUNTER — Other Ambulatory Visit: Payer: Self-pay

## 2021-02-17 VITALS — BP 136/74 | HR 70 | Wt 177.0 lb

## 2021-02-17 DIAGNOSIS — E119 Type 2 diabetes mellitus without complications: Secondary | ICD-10-CM

## 2021-02-17 DIAGNOSIS — F17210 Nicotine dependence, cigarettes, uncomplicated: Secondary | ICD-10-CM

## 2021-02-17 DIAGNOSIS — Z794 Long term (current) use of insulin: Secondary | ICD-10-CM | POA: Diagnosis not present

## 2021-02-17 DIAGNOSIS — R454 Irritability and anger: Secondary | ICD-10-CM

## 2021-02-17 LAB — POCT GLYCOSYLATED HEMOGLOBIN (HGB A1C): Hemoglobin A1C: 5.3 % (ref 4.0–5.6)

## 2021-02-17 MED ORDER — SERTRALINE HCL 50 MG PO TABS: 50.0000 mg | ORAL_TABLET | Freq: Every day | ORAL | 1 refills | Status: DC

## 2021-02-17 NOTE — Progress Notes (Signed)
Established patient visit   Patient: Antonio Howe   DOB: [birth year]   60 y.o. Male  MRN: [id number] Visit Date: 02/17/2021  Today's healthcare provider: Lelon Huh, MD   No chief complaint on file.  Subjective    HPI  Diabetes Mellitus Type II, Follow-up  Lab Results  Component Value Date   HGBA1C 5.8 (A) 10/10/2020   HGBA1C 5.9 (H) 04/02/2020   HGBA1C 5.9 (A) 10/29/2019   Wt Readings from Last 3 Encounters:  02/17/21 177 lb (80.3 kg)  12/26/20 171 lb 9.6 oz (77.8 kg)  10/10/20 165 lb (74.8 kg)   Last seen for diabetes 5 months ago.  Management since then includes no changes. He reports excellent compliance with treatment. He is not having side effects.  Symptoms: No fatigue No foot ulcerations  No appetite changes No nausea  No paresthesia of the feet  No polydipsia  No polyuria No visual disturbances   No vomiting     Home blood sugar records: checked occasionally.  Episodes of hypoglycemia? No    Current insulin regiment: none Most Recent Eye Exam: 12/02/20 Current exercise: none Current diet habits: in general, a "healthy" diet    Pertinent Labs: Lab Results  Component Value Date   CHOL 149 04/02/2020   HDL 56 04/02/2020   LDLCALC 79 04/02/2020   TRIG 73 04/02/2020   CHOLHDL 2.7 04/02/2020   Lab Results  Component Value Date   NA 132 (L) 08/10/2020   K 4.7 08/10/2020   CREATININE 0.91 08/10/2020   GFRNONAA >60 08/10/2020   GFRAA 109 04/02/2020   GLUCOSE 244 (H) 08/10/2020     ---------------------------------------------------------------------------------------------------  Follow up for irritability  The patient was last seen for this 2 months ago. Changes made at last visit include starting Sertraline 50mg  daily.  He reports excellent compliance with treatment. He feels that condition is Improved. Has since been able to quit smoking and feels his mood has been pretty stable since then.  He is not having side effects.    -----------------------------------------------------------------------------------------  No Known Allergies   Medications: Outpatient Medications Prior to Visit  Medication Sig  . aspirin 81 MG tablet Take 1 tablet by mouth daily.  . B COMPLEX VITAMINS PO Take 1 capsule by mouth daily.  . diazepam (VALIUM) 5 MG tablet TAKE 2 TABLETS BY MOUTH ONCE DAILY IF NEEDED  . glipiZIDE (GLUCOTROL XL) 2.5 MG 24 hr tablet TAKE 1 TABLET BY MOUTH EVERY DAY  . glucose blood test strip 1 strip by Does not apply route daily.  Marland Kitchen HYDROcodone-acetaminophen (NORCO) 10-325 MG tablet Take 1 tablet by mouth every 6 (six) hours as needed.  Marland Kitchen lisinopril-hydrochlorothiazide (ZESTORETIC) 10-12.5 MG tablet TAKE 1 TABLET BY MOUTH EVERY DAY  . meloxicam (MOBIC) 15 MG tablet Take 1 tablet (15 mg total) by mouth daily as needed for pain.  Marland Kitchen sertraline (ZOLOFT) 50 MG tablet Take 1 tablet (50 mg total) by mouth daily.  . metoCLOPramide (REGLAN) 10 MG tablet Take 1 tablet (10 mg total) by mouth every 6 (six) hours as needed.   No facility-administered medications prior to visit.    Review of Systems  Constitutional: Negative.   Respiratory: Negative.   Cardiovascular: Negative.   Gastrointestinal: Negative.   Psychiatric/Behavioral: Negative for decreased concentration, dysphoric mood, self-injury, sleep disturbance and suicidal ideas. The patient is not nervous/anxious.        Objective    BP 136/74 (BP Location: Right Arm, Patient Position: Sitting, Cuff Size: Large)  Pulse 70   Wt 177 lb (80.3 kg)   SpO2 97%   BMI 27.72 kg/m     Physical Exam  General appearance:  Well developed, well nourished male, cooperative and in no acute distress Head: Normocephalic, without obvious abnormality, atraumatic Respiratory: Respirations even and unlabored, normal respiratory rate Extremities: All extremities are intact.  Skin: Skin color, texture, turgor normal. No rashes seen  Psych: Appropriate mood and  affect. Neurologic: Mental status: Alert, oriented to person, place, and time, thought content appropriate.   Results for orders placed or performed in visit on 02/17/21  POCT glycosylated hemoglobin (Hb A1C)  Result Value Ref Range   Hemoglobin A1C 5.3 4.0 - 5.6 %    Assessment & Plan     1. Type 2 diabetes mellitus without complication, with long-term current use of insulin (HCC) Very well controlled. A1c continues to go down since stopping smoking. He will stop glipizide for now.   2. Irritability Doing well with initiation sertraline despite stopping smoking. He was encourage to limit alcohol intake to 1-2 drinks a day on average.   - sertraline (ZOLOFT) 50 MG tablet; Take 1 tablet (50 mg total) by mouth daily.  Dispense: 90 tablet; Refill: 1  3. Smoking greater than 30 pack years Congratulated with recent smoking cessation.   Follow up 3 months.          The entirety of the information documented in the History of Present Illness, Review of Systems and Physical Exam were personally obtained by me. Portions of this information were initially documented by the CMA and reviewed by me for thoroughness and accuracy.      Lelon Huh, MD  [facility] [phone] (phone) [phone] (fax)  [place]

## 2021-02-17 NOTE — Patient Instructions (Signed)
.   Limit alcohol to an average of 2 servings a day

## 2021-03-20 ENCOUNTER — Other Ambulatory Visit: Payer: Self-pay | Admitting: Family Medicine

## 2021-03-20 DIAGNOSIS — M5020 Other cervical disc displacement, unspecified cervical region: Secondary | ICD-10-CM

## 2021-03-20 MED ORDER — HYDROCODONE-ACETAMINOPHEN 10-325 MG PO TABS: 1.0000 | ORAL_TABLET | Freq: Four times a day (QID) | ORAL | 0 refills | Status: DC | PRN

## 2021-03-20 NOTE — Telephone Encounter (Signed)
Medication: HYDROcodone-acetaminophen (NORCO) 10-325 MG tablet [[id number]]   Has the patient contacted their pharmacy? YES  (Agent: If no, request that the patient contact the pharmacy for the refill.) (Agent: If yes, when and what did the pharmacy advise?)  Preferred Pharmacy (with phone number or street name): [id number] [place], [place] [address] [place] [address] Phone: [phone] Fax: [phone] Hours: Not open 24 hours    Agent: Please be advised that RX refills may take up to 3 business days. We ask that you follow-up with your pharmacy.

## 2021-03-20 NOTE — Telephone Encounter (Signed)
Requested medication (s) are due for refill today: yes  Requested medication (s) are on the active medication list: yes   Last refill: 02/16/2021  Future visit scheduled: yes   Notes to clinic: this refill cannot be delegated    Requested Prescriptions  Pending Prescriptions Disp Refills   HYDROcodone-acetaminophen (NORCO) 10-325 MG tablet 120 tablet 0    Sig: Take 1 tablet by mouth every 6 (six) hours as needed.      Not Delegated - Analgesics:  Opioid Agonist Combinations Failed - 03/20/2021 11:53 AM      Failed - This refill cannot be delegated      Failed - Urine Drug Screen completed in last 360 days      Passed - Valid encounter within last 6 months    Recent Outpatient Visits           1 month ago Type 2 diabetes mellitus without complication, with long-term current use of insulin [facility])   [facility] Birdie Sons, MD   2 months ago Kennedy, Donald E, MD   5 months ago Type 2 diabetes mellitus without complication, with long-term current use of insulin [facility])   [facility] Birdie Sons, MD   11 months ago Annual physical exam   [facility] Birdie Sons, MD   1 year ago Muscle spasm of back   [facility] Birdie Sons, MD       Future Appointments             In 2 months Fisher, Kirstie Peri, MD [facility] [place]

## 2021-04-10 ENCOUNTER — Ambulatory Visit: Payer: BC Managed Care – PPO | Admitting: Family Medicine

## 2021-04-17 ENCOUNTER — Other Ambulatory Visit: Payer: Self-pay | Admitting: Family Medicine

## 2021-04-17 DIAGNOSIS — M5020 Other cervical disc displacement, unspecified cervical region: Secondary | ICD-10-CM

## 2021-04-17 NOTE — Telephone Encounter (Signed)
Requested medications are due for refill today yes  Requested medications are on the active medication list yes  Last refill 7/8  Last visit 12/26/20  Future visit scheduled 05/22/21  Notes to clinic Not Delegated.

## 2021-04-17 NOTE — Telephone Encounter (Signed)
Copied from [place] [id number]. Topic: Quick Communication - Rx Refill/Question >> Apr 17, 2021  4:07 PM Tessa Lerner A wrote: Medication: HYDROcodone-acetaminophen (NORCO) 10-325 MG tablet   Has the patient contacted their pharmacy? No. (Agent: If no, request that the patient contact the pharmacy for the refill.) (Agent: If yes, when and what did the pharmacy advise?)  Preferred Pharm[facility] (with phone number [facility] name[id number] [address]  Phone:  [phone]:  [phone] Agent: Please be advised that RX refills may take up to 3 business days. We ask that you follow-up with your pharmacy.

## 2021-04-19 MED ORDER — HYDROCODONE-ACETAMINOPHEN 10-325 MG PO TABS: 1.0000 | ORAL_TABLET | Freq: Four times a day (QID) | ORAL | 0 refills | Status: DC | PRN

## 2021-05-14 ENCOUNTER — Other Ambulatory Visit: Payer: Self-pay | Admitting: Family Medicine

## 2021-05-14 NOTE — Telephone Encounter (Signed)
Requested medications are due for refill today.  yes  Requested medications are on the active medications list.  yes  Last refill. Zestoretic 11/04/2020, Meloxicam 03/20/2021  Future visit scheduled.   yes  Notes to clinic.  Rx's failed protocol due to expired lab work.

## 2021-05-19 ENCOUNTER — Other Ambulatory Visit: Payer: Self-pay | Admitting: Family Medicine

## 2021-05-19 DIAGNOSIS — M5020 Other cervical disc displacement, unspecified cervical region: Secondary | ICD-10-CM

## 2021-05-19 NOTE — Telephone Encounter (Signed)
Requested medications are due for refill today.  yes  Requested medications are on the active medications list.  yes  Last refill. 04/19/2021  Future visit scheduled.   yes  Notes to clinic.  Medication not delegated.

## 2021-05-19 NOTE — Telephone Encounter (Signed)
Medication: HYDROcodone-acetaminophen (NORCO) 10-325 MG tablet [id number]   Has the patient contacted their pharmacy? YES  (Agent: If no, request that the patient contact the pharmacy for the refill.) (Agent: If yes, when and what did the pharmacy advise?)  [facility]erred Pharm[facility] (with phone number [id number] [address]: [phone]: [phone]: Not open 24 hours    Agent: Please be advised that RX refills may take up to 3 business days. We ask that you follow-up with your pharmacy.

## 2021-05-20 MED ORDER — HYDROCODONE-ACETAMINOPHEN 10-325 MG PO TABS: 1.0000 | ORAL_TABLET | Freq: Four times a day (QID) | ORAL | 0 refills | Status: DC | PRN

## 2021-05-22 ENCOUNTER — Ambulatory Visit: Payer: BC Managed Care – PPO | Admitting: Family Medicine

## 2021-05-22 ENCOUNTER — Encounter: Payer: Self-pay | Admitting: Family Medicine

## 2021-05-22 ENCOUNTER — Other Ambulatory Visit: Payer: Self-pay

## 2021-05-22 VITALS — BP 159/90 | HR 78 | Temp 99.0°F | Resp 18 | Wt 170.0 lb

## 2021-05-22 DIAGNOSIS — Z794 Long term (current) use of insulin: Secondary | ICD-10-CM

## 2021-05-22 DIAGNOSIS — Z125 Encounter for screening for malignant neoplasm of prostate: Secondary | ICD-10-CM | POA: Diagnosis not present

## 2021-05-22 DIAGNOSIS — S80812S Abrasion, left lower leg, sequela: Secondary | ICD-10-CM

## 2021-05-22 DIAGNOSIS — E119 Type 2 diabetes mellitus without complications: Secondary | ICD-10-CM | POA: Diagnosis not present

## 2021-05-22 DIAGNOSIS — Z23 Encounter for immunization: Secondary | ICD-10-CM | POA: Diagnosis not present

## 2021-05-22 LAB — POCT GLYCOSYLATED HEMOGLOBIN (HGB A1C)
Est. average glucose Bld gHb Est-mCnc: 101
Hemoglobin A1C: 5.1 % (ref 4.0–5.6)

## 2021-05-22 MED ORDER — AMOXICILLIN 500 MG PO CAPS: 1000.0000 mg | ORAL_CAPSULE | Freq: Two times a day (BID) | ORAL | 0 refills | Status: AC

## 2021-05-22 NOTE — Patient Instructions (Addendum)
Please review the attached list of medications and notify my office if there are any errors.   It is recommended to engage in 150 minutes of moderate exercise every week.    Please go to the lab draw station in [address] on the second floor of [facility]  when you are fasting for 8 hours. Normal hours are 8:00am to 11:30am and 1:00pm to 4:00pm Monday through Friday

## 2021-05-22 NOTE — Progress Notes (Signed)
Established patient visit   Patient: Antonio Howe   DOB: 14-Jun-1961   60 y.o. Male  MRN: [id number] Visit Date: 05/22/2021  Today's healthcare provider: Lelon Huh, MD   Chief Complaint  Patient presents with   Diabetes    Subjective    HPI  Diabetes Mellitus Type II, Follow-up  Lab Results  Component Value Date   HGBA1C 5.3 02/17/2021   HGBA1C 5.8 (A) 10/10/2020   HGBA1C 5.9 (H) 04/02/2020   Wt Readings from Last 3 Encounters:  05/22/21 170 lb (77.1 kg)  02/17/21 177 lb (80.3 kg)  12/26/20 171 lb 9.6 oz (77.8 kg)   Last seen for diabetes 3 months ago.  Management since then includes stopping Glipizide. He reports  he has still been taking Glipizide.  He is not having side effects.  Symptoms: No fatigue No foot ulcerations  No appetite changes No nausea  No paresthesia of the feet  No polydipsia  No polyuria No visual disturbances   No vomiting     Home blood sugar records:  blood sugars are not checked  Episodes of hypoglycemia? No    Current insulin regiment: none Most Recent Eye Exam: 12/02/2020 Current exercise: none Current diet habits: well balanced  Pertinent Labs: Lab Results  Component Value Date   CHOL 149 04/02/2020   HDL 56 04/02/2020   LDLCALC 79 04/02/2020   TRIG 73 04/02/2020   CHOLHDL 2.7 04/02/2020   Lab Results  Component Value Date   NA 132 (L) 08/10/2020   K 4.7 08/10/2020   CREATININE 0.91 08/10/2020   GFRNONAA >60 08/10/2020   GFRAA 109 04/02/2020   GLUCOSE 244 (H) 08/10/2020     ---------------------------------------------------------------------------------------------------  Irritability, Follow-up  He was last seen for anxiety 3 months ago. During that visit patient was advised to continue sertraline. He was also encouraged to limit alcohol intake to 1-2 drinks a day on average.   He reports good compliance with treatment. He reports good tolerance of treatment. He is not having side effects.   He  feels his  symptoms are mild and Unchanged since last visit.   GAD-7 Results No flowsheet data found.  PHQ-9 Scores PHQ9 SCORE ONLY 02/17/2021 12/26/2020 03/31/2020  PHQ-9 Total Score 2 6 0    ---------------------------------------------------------------------------------------------------   Quit smoking in May   Medications: Outpatient Medications Prior to Visit  Medication Sig   aspirin 81 MG tablet Take 1 tablet by mouth daily.   B COMPLEX VITAMINS PO Take 1 capsule by mouth daily.   diazepam (VALIUM) 5 MG tablet TAKE 2 TABLETS BY MOUTH ONCE DAILY IF NEEDED   glucose blood test strip 1 strip by Does not apply route daily.   HYDROcodone-acetaminophen (NORCO) 10-325 MG tablet Take 1 tablet by mouth every 6 (six) hours as needed.   lisinopril-hydrochlorothiazide (ZESTORETIC) 10-12.5 MG tablet Take 1 tablet by mouth daily.   meloxicam (MOBIC) 15 MG tablet TAKE 1 TABLET BY MOUTH EVERY DAY AS NEEDED FOR PAIN   sertraline (ZOLOFT) 50 MG tablet Take 1 tablet (50 mg total) by mouth daily.   No facility-administered medications prior to visit.    Review of Systems  Constitutional:  Negative for appetite change, chills and fever.  Respiratory:  Negative for chest tightness, shortness of breath and wheezing.   Cardiovascular:  Negative for chest pain and palpitations.  Gastrointestinal:  Negative for abdominal pain, nausea and vomiting.      Objective    BP (!) 162/98 (BP Location:  Left Arm, Patient Position: Sitting, Cuff Size: Normal)   Pulse 78   Temp 99 F (37.2 C) (Oral)   Resp 18   Wt 170 lb (77.1 kg)   BMI 26.63 kg/m  {Show previous vital signs (optional):23777}  Physical Exam    General: Appearance:     Well developed, well nourished male in no acute distress  MS:   All extremities are intact.    Neurologic:   Awake, alert, oriented x 3. No apparent focal neurological defect.   Skin:   Small abrasion left anterior lower leg with minimal surrounding erythema.      Results for orders placed or performed in visit on 05/22/21  POCT HgB A1C  Result Value Ref Range   Hemoglobin A1C 5.1 4.0 - 5.6 %   Est. average glucose Bld gHb Est-mCnc 101       Assessment & Plan     1. Type 2 diabetes mellitus without complication, with long-term current use of insulin (HCC) Well controlled.  Continue current medications.   - Comprehensive metabolic panel - Lipid panel  2. Need for influenza vaccination  - Flu Vaccine QUAD 36+ mos IM (Fluarix/Fluzone)  3. Prostate cancer screening  - PSA Total (Reflex To Free) ([facility] only)  4. Abrasion of left lower extremity, sequela  - amoxicillin (AMOXIL) 500 MG capsule; Take 2 capsules (1,000 mg total) by mouth 2 (two) times daily for 10 days.  Dispense: 40 capsule; Refill: 0         The entirety of the information documented in the History of Present Illness, Review of Systems and Physical Exam were personally obtained by me. Portions of this information were initially documented by the CMA and reviewed by me for thoroughness and accuracy.     Lelon Huh, MD  [facility] [phone] (phone) [phone] (fax)  [place]

## 2021-05-27 DIAGNOSIS — E119 Type 2 diabetes mellitus without complications: Secondary | ICD-10-CM | POA: Diagnosis not present

## 2021-05-27 DIAGNOSIS — Z125 Encounter for screening for malignant neoplasm of prostate: Secondary | ICD-10-CM | POA: Diagnosis not present

## 2021-05-27 DIAGNOSIS — Z794 Long term (current) use of insulin: Secondary | ICD-10-CM | POA: Diagnosis not present

## 2021-05-28 LAB — COMPREHENSIVE METABOLIC PANEL
ALT: 32 IU/L (ref 0–44)
AST: 24 IU/L (ref 0–40)
Albumin/Globulin Ratio: 2.9 — ABNORMAL HIGH (ref 1.2–2.2)
Albumin: 5.3 g/dL — ABNORMAL HIGH (ref 3.8–4.8)
Alkaline Phosphatase: 71 IU/L (ref 44–121)
BUN/Creatinine Ratio: 14 (ref 10–24)
BUN: 13 mg/dL (ref 8–27)
Bilirubin Total: 1.2 mg/dL (ref 0.0–1.2)
CO2: 23 mmol/L (ref 20–29)
Calcium: 9.9 mg/dL (ref 8.6–10.2)
Chloride: 99 mmol/L (ref 96–106)
Creatinine, Ser: 0.96 mg/dL (ref 0.76–1.27)
Globulin, Total: 1.8 g/dL (ref 1.5–4.5)
Glucose: 137 mg/dL — ABNORMAL HIGH (ref 65–99)
Potassium: 5.4 mmol/L — ABNORMAL HIGH (ref 3.5–5.2)
Sodium: 138 mmol/L (ref 134–144)
Total Protein: 7.1 g/dL (ref 6.0–8.5)
eGFR: 89 mL/min/{1.73_m2} (ref >59)

## 2021-05-28 LAB — LIPID PANEL
Chol/HDL Ratio: 1.9 ratio (ref 0.0–5.0)
Cholesterol, Total: 150 mg/dL (ref 100–199)
HDL: 77 mg/dL (ref >39)
LDL Chol Calc (NIH): 61 mg/dL (ref 0–99)
Triglycerides: 55 mg/dL (ref 0–149)
VLDL Cholesterol Cal: 12 mg/dL (ref 5–40)

## 2021-05-28 LAB — PSA TOTAL (REFLEX TO FREE): Prostate Specific Ag, Serum: 0.8 ng/mL (ref 0.0–4.0)

## 2021-05-30 ENCOUNTER — Other Ambulatory Visit: Payer: Self-pay | Admitting: Family Medicine

## 2021-05-30 DIAGNOSIS — F39 Unspecified mood [affective] disorder: Secondary | ICD-10-CM

## 2021-05-31 NOTE — Telephone Encounter (Signed)
Requested medication (s) are due for refill today: yes  Requested medication (s) are on the active medication list: yes  Last refill:  12/17/20 #50 3 RF  Future visit scheduled: no  Notes to clinic:  med not delegated to NT to RF   Requested Prescriptions  Pending Prescriptions Disp Refills   diazepam (VALIUM) 5 MG tablet [Pharmacy Med Name: DIAZEPAM 5 MG TABLET] 50 tablet 3    Sig: TAKE 2 TABLETS BY MOUTH ONCE DAILY IF NEEDED     Not Delegated - Psychiatry:  Anxiolytics/Hypnotics Failed - 05/30/2021 12:22 PM      Failed - This refill cannot be delegated      Failed - Urine Drug Screen completed in last 360 days      Passed - Valid encounter within last 6 months    Recent Outpatient Visits           1 week ago Type 2 diabetes mellitus without complication, with long-term current use of insulin ([place])   [facility] Birdie Sons, MD   3 months ago Type 2 diabetes mellitus without complication, with long-term current use of insulin [facility])   [facility] Birdie Sons, MD   5 months ago [place], Donald E, MD   7 months ago Type 2 diabetes mellitus without complication, with long-term current use of insulin [facility])   [facility] Birdie Sons, MD   1 year ago Annual physical exam   [facility] Birdie Sons, MD

## 2021-06-17 ENCOUNTER — Other Ambulatory Visit: Payer: Self-pay | Admitting: Family Medicine

## 2021-06-17 DIAGNOSIS — M5020 Other cervical disc displacement, unspecified cervical region: Secondary | ICD-10-CM

## 2021-06-17 NOTE — Telephone Encounter (Signed)
Medication Refill - Medication: HYDROcodone-acetaminophen (NORCO) 10-325 MG   Has the patient contacted their pharmacy? yes (Agent: If no, request that the patient contact the pharmacy for the refill.) (Agent: If yes, when and what did the pharmacy advise?)  Preferred Pharm[facility] (with phone number [facility] street name): [facility] [address] [place], [place] [address] [phone] Has the patient been seen for an appointment in the last year OR does the patient have an upcoming appointment? yes  Agent: Please be advised that RX refills may take up to 3 business days. We ask that you follow-up with your pharmacy.

## 2021-06-17 NOTE — Telephone Encounter (Signed)
Requested medication (s) are due for refill today:   Provider to review  Requested medication (s) are on the active medication list:   Yes  Future visit scheduled:   Yes   Last ordered: 05/20/2021 #120, 0 refills  Non delegated refill   Requested Prescriptions  Pending Prescriptions Disp Refills   HYDROcodone-acetaminophen (NORCO) 10-325 MG tablet 120 tablet 0    Sig: Take 1 tablet by mouth every 6 (six) hours as needed.     Not Delegated - Analgesics:  Opioid Agonist Combinations Failed - 06/17/2021 12:21 PM      Failed - This refill cannot be delegated      Failed - Urine Drug Screen completed in last 360 days      Passed - Valid encounter within last 6 months    Recent Outpatient Visits           3 weeks ago Type 2 diabetes mellitus without complication, with long-term current use of insulin ([place])   [facility] Birdie Sons, MD   4 months ago Type 2 diabetes mellitus without complication, with long-term current use of insulin [facility])   [facility] Birdie Sons, MD   5 months ago [place], Donald E, MD   8 months ago Type 2 diabetes mellitus without complication, with long-term current use of insulin [facility])   [facility] Birdie Sons, MD   1 year ago Annual physical exam   [facility] Birdie Sons, MD

## 2021-06-18 MED ORDER — HYDROCODONE-ACETAMINOPHEN 10-325 MG PO TABS: 1.0000 | ORAL_TABLET | Freq: Four times a day (QID) | ORAL | 0 refills | Status: DC | PRN

## 2021-06-25 ENCOUNTER — Telehealth: Payer: Self-pay | Admitting: Family Medicine

## 2021-06-25 NOTE — Telephone Encounter (Signed)
Pt is calling to see if pt can be obtained sooner than 07/21/21 for a sore on his left leg. Please advise CB- [phone]

## 2021-06-29 ENCOUNTER — Other Ambulatory Visit: Payer: Self-pay

## 2021-06-29 ENCOUNTER — Encounter: Payer: Self-pay | Admitting: Physician Assistant

## 2021-06-29 ENCOUNTER — Ambulatory Visit (INDEPENDENT_AMBULATORY_CARE_PROVIDER_SITE_OTHER): Payer: BC Managed Care – PPO | Admitting: Physician Assistant

## 2021-06-29 VITALS — BP 140/87 | HR 105 | Temp 97.9°F | Resp 16 | Wt 168.6 lb

## 2021-06-29 DIAGNOSIS — S81802A Unspecified open wound, left lower leg, initial encounter: Secondary | ICD-10-CM

## 2021-06-29 MED ORDER — SILVER SULFADIAZINE 1 % EX CREA: 1.0000 "application " | TOPICAL_CREAM | Freq: Every day | CUTANEOUS | 0 refills | Status: DC

## 2021-06-29 NOTE — Patient Instructions (Signed)
Use a small amount of cream topically on area of concern with bandage on top. Change when soiled or wet. F/u 2-3 weeks if not healing, or pain, redness, swelling, oozing increases.

## 2021-06-29 NOTE — Progress Notes (Signed)
      Established patient visit   Patient: Antonio Howe   DOB: 01-Oct-1960   60 y.o. Male  MRN: [id number] Visit Date: 06/29/2021  Today's healthcare provider: Mikey Kirschner, PA-C   Chief Complaint  Patient presents with   Skin Problem   Subjective    HPI HPI   Patient comes in office today with concern of a possible sore/wound on his left leg after injuring himself on a ladder a month ago. Patient denies swelling, warmth to touch or drainage from site. Patient states that he has been cleaning site with rubbing alcohol.  Last edited by Minette Headland, CMA on 06/29/2021  1:41 PM.     Pt has concerns over infection as he previously had osteomyelitis of the same leg.   Medications: Outpatient Medications Prior to Visit  Medication Sig   aspirin 81 MG tablet Take 1 tablet by mouth daily.   B COMPLEX VITAMINS PO Take 1 capsule by mouth daily.   diazepam (VALIUM) 5 MG tablet TAKE 2 TABLETS BY MOUTH ONCE DAILY IF NEEDED   glipiZIDE (GLUCOTROL XL) 2.5 MG 24 hr tablet Take 2.5 mg by mouth daily.   glucose blood test strip 1 strip by Does not apply route daily.   HYDROcodone-acetaminophen (NORCO) 10-325 MG tablet Take 1 tablet by mouth every 6 (six) hours as needed.   lisinopril-hydrochlorothiazide (ZESTORETIC) 10-12.5 MG tablet Take 1 tablet by mouth daily.   [DISCONTINUED] meloxicam (MOBIC) 15 MG tablet TAKE 1 TABLET BY MOUTH EVERY DAY AS NEEDED FOR PAIN   [DISCONTINUED] sertraline (ZOLOFT) 50 MG tablet Take 1 tablet (50 mg total) by mouth daily.   No facility-administered medications prior to visit.    Review of Systems  Constitutional: Negative.   Respiratory: Negative.    Cardiovascular: Negative.   Skin:  Positive for wound.  Neurological: Negative.      Objective    BP 140/87   Pulse (!) 105   Temp 97.9 F (36.6 C) (Oral)   Resp 16   Wt 168 lb 9.6 oz (76.5 kg)   SpO2 95%   BMI 26.41 kg/m    Physical Exam Constitutional:      Appearance: Normal  appearance.  HENT:     Head: Normocephalic.  Cardiovascular:     Rate and Rhythm: Normal rate and regular rhythm.     Heart sounds: Normal heart sounds.  Pulmonary:     Effort: Pulmonary effort is normal.     Breath sounds: Normal breath sounds.  Musculoskeletal:     Left lower leg: Laceration and tenderness present. No edema.     Comments: Minimal surrounding erythema, wound without exudate.  Skin:    General: Skin is warm.  Neurological:     Mental Status: He is alert.     No results found for any visits on 06/29/21.  Assessment & Plan    Non healing wound left lower extremity Does not appear to be infected, advised silver sulfadiazine cream topically to be used daily for 2-3 weeks. Replace bandage when wet or soiled.  If wound worsens, or does not improve in 2-3 weeks advised return to office. If fever, pain w/ ambulation L leg or increased erythema, return to office.  I, Mikey Kirschner, PA-C have reviewed all documentation for this visit. The documentation on 06/29/2021 for the exam, diagnosis, procedures, and orders are all accurate and complete.   Mikey Kirschner, PA-C  [facility] [phone] (phone) [phone] (fax)  [place]

## 2021-06-30 ENCOUNTER — Other Ambulatory Visit: Payer: Self-pay | Admitting: Physician Assistant

## 2021-06-30 DIAGNOSIS — S81802A Unspecified open wound, left lower leg, initial encounter: Secondary | ICD-10-CM

## 2021-06-30 MED ORDER — SILVER SULFADIAZINE 1 % EX CREA: 1.0000 "application " | TOPICAL_CREAM | Freq: Every day | CUTANEOUS | 0 refills | Status: DC

## 2021-06-30 NOTE — Progress Notes (Signed)
Re order med d/t e prescribing error

## 2021-07-20 ENCOUNTER — Telehealth: Payer: Self-pay

## 2021-07-20 DIAGNOSIS — M5020 Other cervical disc displacement, unspecified cervical region: Secondary | ICD-10-CM

## 2021-07-20 MED ORDER — HYDROCODONE-ACETAMINOPHEN 10-325 MG PO TABS: 1.0000 | ORAL_TABLET | Freq: Four times a day (QID) | ORAL | 0 refills | Status: DC | PRN

## 2021-07-20 NOTE — Telephone Encounter (Signed)
Copied from [place] [id number]. Topic: General - Other >> Jul 20, 2021  4:28 PM Loma Boston wrote: Medication Refill - Medication: HYDROcodone-acetaminophen (NORCO) 10-325 MG tablet 120 tablet 0 06/18/2021   Sig - Route: Take 1 tablet by mouth every 6 (six) hours as needed. - Oral  Sent to pharmacy as: HYDROcodone-acetaminophen ([place]) 10-325 MG tablet  Earliest Fill Date: 06/18/2021  E-Prescribing Status: Receipt confirmed by pharmacy (06/18/2021 7:40 AM EDT)     Has the patient contacted their pharmacy? Yes.   (Agent: If no, request that the patient contact the pharmacy for the refill. If patient does not wish to contact the pharmacy document the reason why and proceed with request.) (Agent: If yes, when and what did the pharmacy advise?)call dr  Preferred Pharmacy (with phone number or street name): [id number] [place] [place] [address] [place] [address] Phone: [phone] Fax: [phone]   Has the patient been seen for an appointment in the last year OR does the patient have an upcoming appointment? Yes.  06/24/21  Agent: Please be advised that RX refills may take up to 3 business days. We ask that you follow-up with your pharmacy.

## 2021-07-20 NOTE — Telephone Encounter (Signed)
LOV: 06/29/2021  NOV: None   Thanks,   -Mickel Baas

## 2021-07-30 ENCOUNTER — Other Ambulatory Visit: Payer: Self-pay | Admitting: Family Medicine

## 2021-07-30 NOTE — Telephone Encounter (Signed)
Copied from [place] [id number]. Topic: Quick Communication - Rx Refill/Question >> Jul 30, 2021  4:43 PM Yvette Rack wrote: Medication: glipiZIDE (GLUCOTROL XL) 2.5 MG 24 hr tablet  Has the patient contacted their pharmacy? Yes.  Pt told by pharmacy that refill request was sent in on Tuesday (Agent: If no, request that the patient contact the pharmacy for the refill. If patient does not wish to contact the pharmacy document the reason why and proceed with request.) (Agent: If yes, when and what did the pharmacy advise?)  Preferred Pharmacy (with phone number or street name): [id number] [place], [place] [address] Phone: [phone]   Fax: [phone]  Has the patient been seen for an appointment in the last year OR does the patient have an upcoming appointment? Yes.    Agent: Please be advised that RX refills may take up to 3 business days. We ask that you follow-up with your pharmacy.

## 2021-07-31 MED ORDER — GLIPIZIDE ER 2.5 MG PO TB24: 2.5000 mg | ORAL_TABLET | Freq: Every day | ORAL | 4 refills | Status: DC

## 2021-07-31 NOTE — Telephone Encounter (Signed)
Requested medications are on the active medication list yes  Last visit 05/22/21  Future visit scheduled no  Notes to clinic Historical Provider, please assess. Requested Prescriptions  Pending Prescriptions Disp Refills   glipiZIDE (GLUCOTROL XL) 2.5 MG 24 hr tablet      Sig: Take 1 tablet (2.5 mg total) by mouth daily.     Endocrinology:  Diabetes - Sulfonylureas Passed - 07/30/2021  5:14 PM      Passed - HBA1C is between 0 and 7.9 and within 180 days    Hemoglobin A1C  Date Value Ref Range Status  05/22/2021 5.1 4.0 - 5.6 % Final   Hgb A1c MFr Bld  Date Value Ref Range Status  04/02/2020 5.9 (H) 4.8 - 5.6 % Final    Comment:             Prediabetes: 5.7 - 6.4          Diabetes: >6.4          Glycemic control for adults with diabetes: <7.0           Passed - Valid encounter within last 6 months    Recent Outpatient Visits           1 month ago Non-healing wound of left lower extremity   [facility] Mikey Kirschner, PA-C   2 months ago Type 2 diabetes mellitus without complication, with long-term current use of insulin ([place])   [facility] Birdie Sons, MD   5 months ago Type 2 diabetes mellitus without complication, with long-term current use of insulin [facility])   [facility] Birdie Sons, MD   7 months ago [place], Donald E, MD   9 months ago Type 2 diabetes mellitus without complication, with long-term current use of insulin [facility])   [facility] Caryn Section, Kirstie Peri, MD

## 2021-08-17 ENCOUNTER — Other Ambulatory Visit: Payer: Self-pay | Admitting: Family Medicine

## 2021-08-17 DIAGNOSIS — M5020 Other cervical disc displacement, unspecified cervical region: Secondary | ICD-10-CM

## 2021-08-17 NOTE — Telephone Encounter (Signed)
Copied from [place] [id number]. Topic: Quick Communication - Rx Refill/Question >> Aug 17, 2021  4:42 PM Tessa Lerner A wrote: Medication: HYDROcodone-acetaminophen ([place]) 10-325 MG tablet [[id number]]   Has the patient contacted their pharmacy? No. (Agent: If no, request that the patient contact the pharmacy for the refill. If patient does not wish to contact the pharmacy document the reason why and proceed with request.) (Agent: If yes, when and what did the pharmacy advise?)  Preferred Pharmacy (with phone number or street name): [id number] [place], [place] [address]  Phone:  [phone] Fax:  [phone]    Has the patient been seen for an appointment in the last year OR does the patient have an upcoming appointment? Yes.    Agent: Please be advised that RX refills may take up to 3 business days. We ask that you follow-up with your pharmacy.

## 2021-08-18 MED ORDER — HYDROCODONE-ACETAMINOPHEN 10-325 MG PO TABS: 1.0000 | ORAL_TABLET | Freq: Four times a day (QID) | ORAL | 0 refills | Status: DC | PRN

## 2021-08-18 NOTE — Telephone Encounter (Signed)
Requested medication (s) are due for refill today: yes  Requested medication (s) are on the active medication list: yes  Future visit scheduled: no  Notes to clinic:  This medication can not be delegated, please assess.    Requested Prescriptions  Pending Prescriptions Disp Refills   HYDROcodone-acetaminophen (NORCO) 10-325 MG tablet 120 tablet 0    Sig: Take 1 tablet by mouth every 6 (six) hours as needed.     Not Delegated - Analgesics:  Opioid Agonist Combinations Failed - 08/17/2021  5:30 PM      Failed - This refill cannot be delegated      Failed - Urine Drug Screen completed in last 360 days      Passed - Valid encounter within last 6 months    Recent Outpatient Visits           1 month ago Non-healing wound of left lower extremity   [facility] Mikey Kirschner, PA-C   2 months ago Type 2 diabetes mellitus without complication, with long-term current use of insulin [facility])   [facility] Birdie Sons, MD   6 months ago Type 2 diabetes mellitus without complication, with long-term current use of insulin [facility])   [facility] Birdie Sons, MD   7 months ago [place], Donald E, MD   10 months ago Type 2 diabetes mellitus without complication, with long-term current use of insulin [facility])   [facility] Caryn Section, Kirstie Peri, MD

## 2021-09-18 ENCOUNTER — Other Ambulatory Visit: Payer: Self-pay | Admitting: Family Medicine

## 2021-09-18 DIAGNOSIS — F39 Unspecified mood [affective] disorder: Secondary | ICD-10-CM

## 2021-09-18 MED ORDER — DIAZEPAM 5 MG PO TABS: ORAL_TABLET | ORAL | 3 refills | Status: DC

## 2021-09-18 NOTE — Telephone Encounter (Signed)
Requested medication (s) are due for refill today: yes  Requested medication (s) are on the active medication list: yes  Last refill:  06/01/21 #50 3 refills  Future visit scheduled: no  Notes to clinic:  not delegated per protocol. Dx code needed. Do you want to refill Rx?     Requested Prescriptions  Pending Prescriptions Disp Refills   diazepam (VALIUM) 5 MG tablet 50 tablet 3     Not Delegated - Psychiatry:  Anxiolytics/Hypnotics Failed - 09/18/2021 12:24 PM      Failed - This refill cannot be delegated      Failed - Urine Drug Screen completed in last 360 days      Passed - Valid encounter within last 6 months    Recent Outpatient Visits           2 months ago Non-healing wound of left lower extremity   [facility] Mikey Kirschner, PA-C   3 months ago Type 2 diabetes mellitus without complication, with long-term current use of insulin [facility])   [facility] Birdie Sons, MD   7 months ago Type 2 diabetes mellitus without complication, with long-term current use of insulin [facility])   [facility] Birdie Sons, MD   8 months ago [place], Donald E, MD   11 months ago Type 2 diabetes mellitus without complication, with long-term current use of insulin [facility])   [facility] Caryn Section, Kirstie Peri, MD

## 2021-09-18 NOTE — Telephone Encounter (Signed)
Copied from [place] [phone]. Topic: Quick Communication - Rx Refill/Question >> Sep 18, 2021 11:50 AM Leward Quan A wrote: Medication: diazepam (VALIUM) 5 MG tablet   Has the patient contacted their pharmacy? Yes.  Call office  (Agent: If no, request that the patient contact the pharmacy for the refill. If patient does not wish to contact the pharmacy document the reason why and proceed with request.) (Agent: If yes, when and what did the pharmacy advise?)  Preferred Pharmacy (with phone number or street name): [id number] [place], [place] [address]  Phone:  [phone] Fax:  [phone]    Has the patient been seen for an appointment in the last year OR does the patient have an upcoming appointment? Yes.    Agent: Please be advised that RX refills may take up to 3 business days. We ask that you follow-up with your pharmacy.

## 2021-09-18 NOTE — Telephone Encounter (Signed)
LOV: 05/22/2021   Thanks,   -Mickel Baas

## 2021-09-23 ENCOUNTER — Other Ambulatory Visit: Payer: Self-pay | Admitting: Family Medicine

## 2021-09-23 DIAGNOSIS — M5020 Other cervical disc displacement, unspecified cervical region: Secondary | ICD-10-CM

## 2021-09-23 MED ORDER — HYDROCODONE-ACETAMINOPHEN 10-325 MG PO TABS: 1.0000 | ORAL_TABLET | Freq: Four times a day (QID) | ORAL | 0 refills | Status: DC | PRN

## 2021-09-23 NOTE — Telephone Encounter (Signed)
Medication Refill - Medication: HYDROcodone-acetaminophen (NORCO) 10-325 MG tablet  Has the patient contacted their pharmacy? Yes.   (Agent: If no, request that the patient contact the pharmacy for the refill. If patient does not wish to contact the pharmacy document the reason why and proceed with request.) (Agent: If yes, when and what did the pharmacy advise?)  Preferred Pharmacy (with phone number or street name):  [id number] [place], [place] [address]  [address] [place] [address]  Phone: [phone] Fax: [phone]   Has the patient been seen for an appointment in the last year OR does the patient have an upcoming appointment? Yes.    Agent: Please be advised that RX refills may take up to 3 business days. We ask that you follow-up with your pharmacy.

## 2021-09-23 NOTE — Telephone Encounter (Signed)
Requested medication (s) are due for refill today:   Provider to review  Requested medication (s) are on the active medication list:   Yes  Future visit scheduled:   No Seen 2 mo. ago   Last ordered: 08/18/2021 #120, 0 refill  Non delegated refill   Requested Prescriptions  Pending Prescriptions Disp Refills   HYDROcodone-acetaminophen (NORCO) 10-325 MG tablet 120 tablet 0    Sig: Take 1 tablet by mouth every 6 (six) hours as needed.     Not Delegated - Analgesics:  Opioid Agonist Combinations Failed - 09/23/2021 11:58 AM      Failed - This refill cannot be delegated      Failed - Urine Drug Screen completed in last 360 days      Passed - Valid encounter within last 6 months    Recent Outpatient Visits           2 months ago Non-healing wound of left lower extremity   [facility] Mikey Kirschner, PA-C   4 months ago Type 2 diabetes mellitus without complication, with long-term current use of insulin [facility])   [facility] Birdie Sons, MD   7 months ago Type 2 diabetes mellitus without complication, with long-term current use of insulin [facility])   [facility] Birdie Sons, MD   9 months ago [place], Donald E, MD   11 months ago Type 2 diabetes mellitus without complication, with long-term current use of insulin [facility])   [facility] Caryn Section, Kirstie Peri, MD

## 2021-10-05 ENCOUNTER — Other Ambulatory Visit: Payer: Self-pay | Admitting: *Deleted

## 2021-10-05 DIAGNOSIS — F17210 Nicotine dependence, cigarettes, uncomplicated: Secondary | ICD-10-CM

## 2021-10-23 ENCOUNTER — Other Ambulatory Visit: Payer: Self-pay

## 2021-10-23 ENCOUNTER — Ambulatory Visit
Admission: RE | Admit: 2021-10-23 | Discharge: 2021-10-23 | Disposition: A | Discharge status: Home / Self Care | Payer: BC Managed Care – PPO | Source: Ambulatory Visit | Attending: Acute Care | Admitting: Acute Care

## 2021-10-23 ENCOUNTER — Other Ambulatory Visit: Payer: Self-pay | Admitting: Family Medicine

## 2021-10-23 DIAGNOSIS — F39 Unspecified mood [affective] disorder: Secondary | ICD-10-CM

## 2021-10-23 DIAGNOSIS — Z87891 Personal history of nicotine dependence: Secondary | ICD-10-CM | POA: Diagnosis not present

## 2021-10-23 DIAGNOSIS — F17210 Nicotine dependence, cigarettes, uncomplicated: Secondary | ICD-10-CM | POA: Insufficient documentation

## 2021-10-23 DIAGNOSIS — M5020 Other cervical disc displacement, unspecified cervical region: Secondary | ICD-10-CM

## 2021-10-23 MED ORDER — HYDROCODONE-ACETAMINOPHEN 10-325 MG PO TABS: 1.0000 | ORAL_TABLET | Freq: Four times a day (QID) | ORAL | 0 refills | Status: DC | PRN

## 2021-10-23 MED ORDER — DIAZEPAM 5 MG PO TABS: ORAL_TABLET | ORAL | 3 refills | Status: DC

## 2021-10-23 NOTE — Telephone Encounter (Signed)
Please advise refill?  LOV: 06/30/2021 NOV: none Last refill:   HYDROcodone-acetaminophen-09/23/2021 #120 w/0 refills  diazepam #50 w/3 refills

## 2021-10-23 NOTE — Telephone Encounter (Signed)
Patient states he will run out on Monday, patient apologize for not calling in advance but would like PCP to send in today

## 2021-10-23 NOTE — Telephone Encounter (Signed)
Requested medication (s) are due for refill today: no  Requested medication (s) are on the active medication list: yes  Last refill:  Valium 09/18/21, Norco 09/23/21  Future visit scheduled: no  Notes to clinic:  Unable to refill per protocol, cannot delegate.      Requested Prescriptions  Pending Prescriptions Disp Refills   diazepam (VALIUM) 5 MG tablet 50 tablet 3    Sig: TAKE 2 TABLETS BY MOUTH ONCE DAILY IF NEEDED     Not Delegated - Psychiatry: Anxiolytics/Hypnotics 2 Failed - 10/23/2021  9:25 AM      Failed - This refill cannot be delegated      Failed - Urine Drug Screen completed in last 360 days      Passed - Patient is not pregnant      Passed - Valid encounter within last 6 months    Recent Outpatient Visits           3 months ago Non-healing wound of left lower extremity   [facility] Thedore Mins, [place], PA-C   5 months ago Type 2 diabetes mellitus without complication, with long-term current use of insulin ([place])   [facility] Birdie Sons, MD   8 months ago Type 2 diabetes mellitus without complication, with long-term current use of insulin [facility])   [facility] Birdie Sons, MD   10 months ago [place], Donald E, MD   1 year ago Type 2 diabetes mellitus without complication, with long-term current use of insulin [facility])   [facility] Birdie Sons, MD               HYDROcodone-acetaminophen (NORCO) 10-325 MG tablet 120 tablet 0    Sig: Take 1 tablet by mouth every 6 (six) hours as needed.     Not Delegated - Analgesics:  Opioid Agonist Combinations Failed - 10/23/2021  9:25 AM      Failed - This refill cannot be delegated      Failed - Urine Drug Screen completed in last 360 days      Failed - Valid encounter within last 3 months    Recent Outpatient Visits           3 months ago Non-healing wound of left lower extremity   [facility] Mikey Kirschner, PA-C   5 months ago Type 2 diabetes mellitus without complication, with long-term current use of insulin [facility])   [facility] Birdie Sons, MD   8 months ago Type 2 diabetes mellitus without complication, with long-term current use of insulin [facility])   [facility] Birdie Sons, MD   10 months ago [place], Donald E, MD   1 year ago Type 2 diabetes mellitus without complication, with long-term current use of insulin [facility])   [facility] Birdie Sons, MD

## 2021-10-23 NOTE — Telephone Encounter (Signed)
Medication Refill - Medication: HYDROcodone-acetaminophen (NORCO) 10-325 MG tablet diazepam (VALIUM) 5 MG tablet  Has the patient contacted their pharmacy? No. Pt stated he is always told to contact PCP.   (Agent: If no, request that the patient contact the pharmacy for the refill. If patient does not wish to contact the pharmacy document the reason why and proceed with request.)   Preferred Pharmacy (with phone number or street name):  [id number] [place] [place] [address]  [address] [place] [address]  Phone: [phone] Fax: [phone]  Hours: Not open 24 hours   Has the patient been seen for an appointment in the last year OR does the patient have an upcoming appointment? Yes.    Agent: Please be advised that RX refills may take up to 3 business days. We ask that you follow-up with your pharmacy.

## 2021-10-26 ENCOUNTER — Encounter: Payer: Self-pay | Admitting: Family Medicine

## 2021-10-26 ENCOUNTER — Other Ambulatory Visit: Payer: Self-pay

## 2021-10-26 DIAGNOSIS — F17210 Nicotine dependence, cigarettes, uncomplicated: Secondary | ICD-10-CM

## 2021-10-26 DIAGNOSIS — J439 Emphysema, unspecified: Secondary | ICD-10-CM | POA: Insufficient documentation

## 2021-10-26 DIAGNOSIS — Z87891 Personal history of nicotine dependence: Secondary | ICD-10-CM

## 2021-11-16 ENCOUNTER — Other Ambulatory Visit: Payer: Self-pay | Admitting: Family Medicine

## 2021-11-16 DIAGNOSIS — M5020 Other cervical disc displacement, unspecified cervical region: Secondary | ICD-10-CM

## 2021-11-16 NOTE — Telephone Encounter (Signed)
Medication Refill - Medication:  ?HYDROcodone-acetaminophen (NORCO) 10-325 MG tablet 120 tablet 0 10/23/2021    ?Sig - Route: Take 1 tablet by mouth every 6 (six) hours as needed. - Oral   ?Sent to pharmacy as: HYDROcodone-acetaminophen (NORCO) 10-325 MG tablet   ?Earliest Fill Date: 10/23/2021   ?E-Prescribing Status: Receipt confirmed by pharmacy (10/23/2021  4:59 PM EST   ? ? ?Has the patient contacted their pharmacy? Yes.   ?(Agent: If no, request that the patient contact the pharmacy for the refill. If patient does not wish to contact the pharmacy document the reason why and proceed with request.) states dr Caryn Section has to send in ?(Agent: If yes, when and what did the pharmacy advise?) above ?  ?  ?  ? ?Preferred Pharmacy (with phone number or street name):  ?[id number] [place] [facility] [address]  [address] [place] ?Phone: [phone]: [phone] ? ?Has the patient been seen for an appointment in the last year OR does the patient have an upcoming appointment? Yes.   10/23/21 ? ?Agent: Please be advised that RX refills may take up to 3 business days. We ask that you follow-up with your pharmacy.  ?

## 2021-11-17 NOTE — Telephone Encounter (Signed)
Requested medication (s) are due for refill today: 11/20/21 ? ?Requested medication (s) are on the active medication list: yes ? ?Last refill:  10/23/21 #120 0 refills ? ?Future visit scheduled: yes in 3 months  ? ?Notes to clinic:  not delegated per protocol. Patient requesting annual physical to be scheduled and med refills on the same appt. Please review if annual visit can be 03/02/22? If any issues with appt, patient requesting a call back. Will need to request time off for work. ? ? ?  ?Requested Prescriptions  ?Pending Prescriptions Disp Refills  ? HYDROcodone-acetaminophen (NORCO) 10-325 MG tablet 120 tablet 0  ?  Sig: Take 1 tablet by mouth every 6 (six) hours as needed.  ?  ? Not Delegated - Analgesics:  Opioid Agonist Combinations Failed - 11/16/2021  5:12 PM  ?  ?  Failed - This refill cannot be delegated  ?  ?  Failed - Urine Drug Screen completed in last 360 days  ?  ?  Failed - Valid encounter within last 3 months  ?  Recent Outpatient Visits   ? ?      ? 4 months ago Non-healing wound of left lower extremity  ? [facility] Thedore Mins, Ria Comment, PA-C  ? 5 months ago Type 2 diabetes mellitus without complication, with long-term current use of insulin ([place])  ? [facility] Birdie Sons, MD  ? 9 months ago Type 2 diabetes mellitus without complication, with long-term current use of insulin ([place])  ? [facility] Birdie Sons, MD  ? 10 months ago Irritability  ? [facility] Birdie Sons, MD  ? 1 year ago Type 2 diabetes mellitus without complication, with long-term current use of insulin ([place])  ? [facility] Caryn Section, Kirstie Peri, MD  ? ?  ?  ?Future Appointments   ? ?        ? In 3 months Fisher, Kirstie Peri, MD [facility]  ? ?  ? ?  ?  ?  ? ?

## 2021-11-18 MED ORDER — HYDROCODONE-ACETAMINOPHEN 10-325 MG PO TABS: 1.0000 | ORAL_TABLET | Freq: Four times a day (QID) | ORAL | 0 refills | Status: DC | PRN

## 2021-12-21 ENCOUNTER — Other Ambulatory Visit: Payer: Self-pay | Admitting: Family Medicine

## 2021-12-21 DIAGNOSIS — M5020 Other cervical disc displacement, unspecified cervical region: Secondary | ICD-10-CM

## 2021-12-21 NOTE — Telephone Encounter (Signed)
Medication: HYDROcodone-acetaminophen (NORCO) 10-325 MG tablet [[id number]] ? ?Has the patient contacted their pharmacy? YES  ?(Agent: If no, request that the patient contact the pharmacy for the refill. If patient does not wish to contact the pharmacy document the reason why and proceed with request.) ?(Agent: If yes, when and what did the pharmacy advise?) ? ?Preferred Pharmacy (with phone number or street name): [id number] [place] [address] [address] [place]?Phone: [phone]: [phone]?Hours: Not open 24 hours ? ? ?Has the patient been seen for an appointment in the last year OR does the patient have an upcoming appointment? YES 03/02/22 ? ?Agent: Please be advised that RX refills may take up to 3 business days. We ask that you follow-up with your pharmacy. ?

## 2021-12-22 NOTE — Telephone Encounter (Signed)
Requested medications are due for refill today.  yes ? ?Requested medications are on the active medications list.  yes ? ?Last refill. 11/18/2021 #120 0 refills ? ?Future visit scheduled.   yes ? ?Notes to clinic.  Medication refill is not delegated. ? ? ? ?Requested Prescriptions  ?Pending Prescriptions Disp Refills  ? HYDROcodone-acetaminophen (NORCO) 10-325 MG tablet 120 tablet 0  ?  Sig: Take 1 tablet by mouth every 6 (six) hours as needed.  ?  ? Not Delegated - Analgesics:  Opioid Agonist Combinations Failed - 12/21/2021  5:22 PM  ?  ?  Failed - This refill cannot be delegated  ?  ?  Failed - Urine Drug Screen completed in last 360 days  ?  ?  Failed - Valid encounter within last 3 months  ?  Recent Outpatient Visits   ? ?      ? 5 months ago Non-healing wound of left lower extremity  ? [facility] Thedore Mins, [place], PA-C  ? 7 months ago Type 2 diabetes mellitus without complication, with long-term current use of insulin ([place])  ? [facility] Birdie Sons, MD  ? 10 months ago Type 2 diabetes mellitus without complication, with long-term current use of insulin ([place])  ? [facility] Birdie Sons, MD  ? 12 months ago [place]ritability  ? [facility] Birdie Sons, MD  ? 1 year ago Type 2 diabetes mellitus without complication, with long-term current use of insulin ([place])  ? [facility] Caryn Section, Kirstie Peri, MD  ? ?  ?  ?Future Appointments   ? ?        ? In 2 months Fisher, Kirstie Peri, MD [facility]  ? ?  ? ?  ?  ?  ?  ?

## 2021-12-23 MED ORDER — HYDROCODONE-ACETAMINOPHEN 10-325 MG PO TABS: 1.0000 | ORAL_TABLET | Freq: Four times a day (QID) | ORAL | 0 refills | Status: DC | PRN

## 2021-12-23 NOTE — Telephone Encounter (Signed)
Covering refill for dr Caryn Section. ?

## 2022-01-18 ENCOUNTER — Other Ambulatory Visit: Payer: Self-pay | Admitting: Family Medicine

## 2022-01-18 DIAGNOSIS — M5020 Other cervical disc displacement, unspecified cervical region: Secondary | ICD-10-CM

## 2022-01-18 NOTE — Telephone Encounter (Signed)
Medication Refill - Medication: HYDROcodone-acetaminophen (NORCO) 10-325 MG tablet  ? ?Has the patient contacted their pharmacy? Yes.   ?(Agent: If no, request that the patient contact the pharmacy for the refill. If patient does not wish to contact the pharmacy document the reason why and proceed with request.) ?(Agent: If yes, when and what did the pharmacy advise?) ? ?Preferred Pharmacy (with phone number or street name):  ?[id number] [place] [facility] [address]  ?[address] [place] ?Phone: [phone]: [phone] ? ?Has the patient been seen for an appointment in the last year OR does the patient have an upcoming appointment? Yes.   ? ?Agent: Please be advised that RX refills may take up to 3 business days. We ask that you follow-up with your pharmacy. ? ?

## 2022-01-19 NOTE — Telephone Encounter (Signed)
Requested medication (s) are due for refill today - PCP review ? ?Requested medication (s) are on the active medication list -yes ? ?Future visit scheduled -yes ? ?Last refill: 12/23/21 #120 ? ?Notes to clinic: non delegated Rx ? ?Requested Prescriptions  ?Pending Prescriptions Disp Refills  ? HYDROcodone-acetaminophen (NORCO) 10-325 MG tablet 120 tablet 0  ?  Sig: Take 1 tablet by mouth every 6 (six) hours as needed.  ?  ? Not Delegated - Analgesics:  Opioid Agonist Combinations Failed - 01/18/2022  5:45 PM  ?  ?  Failed - This refill cannot be delegated  ?  ?  Failed - Urine Drug Screen completed in last 360 days  ?  ?  Failed - Valid encounter within last 3 months  ?  Recent Outpatient Visits   ? ?      ? 6 months ago Non-healing wound of left lower extremity  ? [facility] Thedore Mins, Ria Comment, PA-C  ? 8 months ago Type 2 diabetes mellitus without complication, with long-term current use of insulin ([place])  ? [facility] Birdie Sons, MD  ? 11 months ago Type 2 diabetes mellitus without complication, with long-term current use of insulin ([place])  ? [facility] Caryn Section, Kirstie Peri, MD  ? 1 year ago Irritability  ? [facility] Birdie Sons, MD  ? 1 year ago Type 2 diabetes mellitus without complication, with long-term current use of insulin ([place])  ? [facility] Caryn Section, Kirstie Peri, MD  ? ?  ?  ?Future Appointments   ? ?        ? In 1 month Fisher, Kirstie Peri, MD [facility]  ? ?  ? ? ?  ?  ?  ? ? ? ?Requested Prescriptions  ?Pending Prescriptions Disp Refills  ? HYDROcodone-acetaminophen (NORCO) 10-325 MG tablet 120 tablet 0  ?  Sig: Take 1 tablet by mouth every 6 (six) hours as needed.  ?  ? Not Delegated - Analgesics:  Opioid Agonist Combinations Failed - 01/18/2022  5:45 PM  ?  ?  Failed - This refill cannot be delegated  ?  ?  Failed - Urine Drug Screen completed in last 360 days  ?  ?  Failed - Valid encounter within last 3  months  ?  Recent Outpatient Visits   ? ?      ? 6 months ago Non-healing wound of left lower extremity  ? [facility] Thedore Mins, [place], PA-C  ? 8 months ago Type 2 diabetes mellitus without complication, with long-term current use of insulin ([place])  ? [facility] Birdie Sons, MD  ? 11 months ago Type 2 diabetes mellitus without complication, with long-term current use of insulin ([place])  ? [facility] Caryn Section, Kirstie Peri, MD  ? 1 year ago Irritability  ? [facility] Birdie Sons, MD  ? 1 year ago Type 2 diabetes mellitus without complication, with long-term current use of insulin ([place])  ? [facility] Caryn Section, Kirstie Peri, MD  ? ?  ?  ?Future Appointments   ? ?        ? In 1 month Fisher, Kirstie Peri, MD [facility]  ? ?  ? ? ?  ?  ?  ? ? ? ?

## 2022-01-20 MED ORDER — HYDROCODONE-ACETAMINOPHEN 10-325 MG PO TABS: 1.0000 | ORAL_TABLET | Freq: Four times a day (QID) | ORAL | 0 refills | Status: DC | PRN

## 2022-01-28 ENCOUNTER — Other Ambulatory Visit: Payer: Self-pay

## 2022-01-28 ENCOUNTER — Emergency Department: Payer: BC Managed Care – PPO

## 2022-01-28 ENCOUNTER — Emergency Department
Admission: EM | Admit: 2022-01-28 | Discharge: 2022-01-28 | Disposition: A | Discharge status: Home / Self Care | Payer: BC Managed Care – PPO | Attending: Emergency Medicine | Admitting: Emergency Medicine

## 2022-01-28 DIAGNOSIS — R519 Headache, unspecified: Secondary | ICD-10-CM | POA: Diagnosis not present

## 2022-01-28 DIAGNOSIS — R112 Nausea with vomiting, unspecified: Secondary | ICD-10-CM | POA: Insufficient documentation

## 2022-01-28 DIAGNOSIS — R42 Dizziness and giddiness: Secondary | ICD-10-CM | POA: Diagnosis not present

## 2022-01-28 DIAGNOSIS — R0789 Other chest pain: Secondary | ICD-10-CM | POA: Insufficient documentation

## 2022-01-28 DIAGNOSIS — R0602 Shortness of breath: Secondary | ICD-10-CM | POA: Diagnosis not present

## 2022-01-28 DIAGNOSIS — R079 Chest pain, unspecified: Secondary | ICD-10-CM | POA: Diagnosis not present

## 2022-01-28 DIAGNOSIS — Z20822 Contact with and (suspected) exposure to covid-19: Secondary | ICD-10-CM | POA: Insufficient documentation

## 2022-01-28 LAB — TROPONIN I (HIGH SENSITIVITY)
Troponin I (High Sensitivity): 10 ng/L (ref <18)
Troponin I (High Sensitivity): 10 ng/L (ref <18)

## 2022-01-28 LAB — BASIC METABOLIC PANEL
Anion gap: 12 (ref 5–15)
BUN: 25 mg/dL — ABNORMAL HIGH (ref 8–23)
CO2: 25 mmol/L (ref 22–32)
Calcium: 9.4 mg/dL (ref 8.9–10.3)
Chloride: 98 mmol/L (ref 98–111)
Creatinine, Ser: 1.11 mg/dL (ref 0.61–1.24)
GFR, Estimated: >60 mL/min (ref >60)
Glucose, Bld: 174 mg/dL — ABNORMAL HIGH (ref 70–99)
Potassium: 3.9 mmol/L (ref 3.5–5.1)
Sodium: 135 mmol/L (ref 135–145)

## 2022-01-28 LAB — LACTIC ACID, PLASMA: Lactic Acid, Venous: 1.1 mmol/L (ref 0.5–1.9)

## 2022-01-28 LAB — RESP PANEL BY RT-PCR (FLU A&B, COVID) ARPGX2
Influenza A by PCR: NEGATIVE
Influenza B by PCR: NEGATIVE
SARS Coronavirus 2 by RT PCR: NEGATIVE

## 2022-01-28 LAB — HEPATIC FUNCTION PANEL
ALT: 32 U/L (ref 0–44)
AST: 22 U/L (ref 15–41)
Albumin: 4.6 g/dL (ref 3.5–5.0)
Alkaline Phosphatase: 60 U/L (ref 38–126)
Bilirubin, Direct: 0.3 mg/dL — ABNORMAL HIGH (ref 0.0–0.2)
Indirect Bilirubin: 1.5 mg/dL — ABNORMAL HIGH (ref 0.3–0.9)
Total Bilirubin: 1.8 mg/dL — ABNORMAL HIGH (ref 0.3–1.2)
Total Protein: 7.6 g/dL (ref 6.5–8.1)

## 2022-01-28 LAB — CBC
HCT: 49.5 % (ref 39.0–52.0)
Hemoglobin: 17.6 g/dL — ABNORMAL HIGH (ref 13.0–17.0)
MCH: 33.3 pg (ref 26.0–34.0)
MCHC: 35.6 g/dL (ref 30.0–36.0)
MCV: 93.6 fL (ref 80.0–100.0)
Platelets: 269 10*3/uL (ref 150–400)
RBC: 5.29 MIL/uL (ref 4.22–5.81)
RDW: 11.9 % (ref 11.5–15.5)
WBC: 14.4 10*3/uL — ABNORMAL HIGH (ref 4.0–10.5)
nRBC: 0 % (ref 0.0–0.2)

## 2022-01-28 LAB — PROCALCITONIN: Procalcitonin: <0.1 ng/mL

## 2022-01-28 LAB — LIPASE, BLOOD: Lipase: 32 U/L (ref 11–51)

## 2022-01-28 MED ORDER — DIPHENHYDRAMINE HCL 50 MG/ML IJ SOLN
12.5000 mg | Freq: Once | INTRAMUSCULAR | Status: AC
  Administered 2022-01-28: 12.5 mg via INTRAVENOUS
  Filled 2022-01-28: qty 1

## 2022-01-28 MED ORDER — METOCLOPRAMIDE HCL 5 MG/ML IJ SOLN
10.0000 mg | Freq: Once | INTRAMUSCULAR | Status: AC
  Administered 2022-01-28: 10 mg via INTRAVENOUS
  Filled 2022-01-28: qty 2

## 2022-01-28 MED ORDER — ONDANSETRON 4 MG PO TBDP: 4.0000 mg | ORAL_TABLET | Freq: Three times a day (TID) | ORAL | 0 refills | Status: AC | PRN

## 2022-01-28 MED ORDER — DROPERIDOL 2.5 MG/ML IJ SOLN
2.5000 mg | Freq: Once | INTRAMUSCULAR | Status: AC
  Administered 2022-01-28: 2.5 mg via INTRAVENOUS
  Filled 2022-01-28: qty 2

## 2022-01-28 MED ORDER — SODIUM CHLORIDE 0.9 % IV BOLUS
1000.0000 mL | Freq: Once | INTRAVENOUS | Status: AC
  Administered 2022-01-28: 1000 mL via INTRAVENOUS

## 2022-01-28 MED ORDER — DIPHENHYDRAMINE HCL 50 MG/ML IJ SOLN
12.5000 mg | Freq: Once | INTRAMUSCULAR | Status: AC
  Administered 2022-01-28: 12.5 mg via INTRAVENOUS

## 2022-01-28 NOTE — ED Provider Notes (Signed)
[facility] Provider Note    Event Date/Time   First MD Initiated Contact with Patient 01/28/22 1119     (approximate)   History   Chest Pain   HPI  Antonio Howe is a 61 y.o. male with diabetes who comes in with concern for chest pain.  Patient reports not feeling well since yesterday.  He reports having a little bit of a headache and some nausea and some chest discomfort and that he called EMS they got an EKG and it was normal and that he vomited 1 time instead they did he felt better.  However he woke up this morning initially felt fine and then around 6 AM he started developed a headache and nausea and overall just not feeling well again.  The headache was gradually increasing not sudden or severe in onset.  He did take some ibuprofen prior to coming in but continued to have symptoms.  He denies any coughing, congestion, abdominal pain, dysuria.  Patient denies any sore throat, tick bites, scratches on his skin or other concerns.   I reviewed patient's primary care doctor note from family medicine on 06/29/2021   Physical Exam   Triage Vital Signs: ED Triage Vitals  Enc Vitals Group     BP 01/28/22 1022 (!) 168/97     Pulse Rate 01/28/22 1022 74     Resp 01/28/22 1022 18     Temp 01/28/22 1022 98.7 F (37.1 C)     Temp Source 01/28/22 1022 Oral     SpO2 01/28/22 1022 98 %     Weight 01/28/22 1015 175 lb (79.4 kg)     Height 01/28/22 1015 '5\' 6"'$  (1.676 m)     Head Circumference --      Peak Flow --      Pain Score 01/28/22 1015 5     Pain Loc --      Pain Edu? --      Excl. in [place]? --     Most recent vital signs: Vitals:   01/28/22 1022  BP: (!) 168/97  Pulse: 74  Resp: 18  Temp: 98.7 F (37.1 C)  SpO2: 98%     General: Awake, no distress.  CV:  Good peripheral perfusion.  Resp:  Normal effort.  Clear lungs no wheezing Abd:  No distention.  Soft and nontender Other:  Cn Nerves II through XII are intact equal strength in arms and  legs.  Full range of motion of neck.  Pupils equal round and reactive.  Extraocular movements are intact.   ED Results / Procedures / Treatments   Labs (all labs ordered are listed, but only abnormal results are displayed) Labs Reviewed  BASIC METABOLIC PANEL - Abnormal; Notable for the following components:      Result Value   Glucose, Bld 174 (*)    BUN 25 (*)    All other components within normal limits  CBC - Abnormal; Notable for the following components:   WBC 14.4 (*)    Hemoglobin 17.6 (*)    All other components within normal limits  TROPONIN I (HIGH SENSITIVITY)     EKG  My interpretation of EKG:  Normal sinus rate of 74 no ST elevation, no T wave inversions, normal intervals  RADIOLOGY  I have personally reviewed the chest x-ray and interpreted it without any evidence of pneumothorax or pneumonia   PROCEDURES:  Critical Care performed: No  Procedures   MEDICATIONS ORDERED IN ED: Medications  sodium chloride 0.9 % bolus 1,000 mL (1,000 mLs Intravenous New Bag/Given 01/28/22 1211)  metoCLOPramide (REGLAN) injection 10 mg (10 mg Intravenous Given 01/28/22 1204)  diphenhydrAMINE (BENADRYL) injection 12.5 mg (12.5 mg Intravenous Given 01/28/22 1203)  diphenhydrAMINE (BENADRYL) injection 12.5 mg (12.5 mg Intravenous Given 01/28/22 1216)  droperidol (INAPSINE) 2.5 MG/ML injection 2.5 mg (2.5 mg Intravenous Given 01/28/22 1319)     IMPRESSION / MDM / ASSESSMENT AND PLAN / ED COURSE  I reviewed the triage vital signs and the nursing notes.  Patient comes in with multiple symptoms including headache, nausea, some chest discomfort.  This concerning for acute pathology that could be life threatening differential including- such as ACS, bacterial infection, viral infection, intracranial hemorrhage, brain mass.  We will proceed with CT imaging and blood work.  Abdomen soft nontender.  Low suspicion for acute abdominal process.  Patient has full range of motion of neck and  normal cranial nerves doubt meningitis given well-appearing.  I considered the possibility of subarachnoid but seems less likely given symptom onset was this morning CT would be pretty sensitive but does not sound sudden or severe in onset.  He reports the headache was gradually worsening.   BMP shows slightly elevated glucose but no evidence of DKA.  CBC does show elevated white count.  Troponin is negative  CT head negative  12:59 PM troponins are negative x2.  Lactate is normal.  COVID, flu are negative.  White count was slightly elevated at 14.  Hepatic function slightly elevated but again he has no right upper quadrant tenderness on repeat assessment he does endorse daily alcohol drinking which could be contributing.  Denies significant Tylenol use.  Lipase is normal.  On reassessment patient still having continued symptoms so we will trial some droperidol to see if that helps with symptoms of headache and nausea.  On repeat assessment he does not have any chest pain or abdominal pains do not feel like CT imaging of this would be beneficial.  2:55 PM on repeat assessment patient reports feeling better.  He has tolerated p.o. without any nausea or vomiting.  He continues to deny any chest pain or shortness of breath.  He remains afebrile on recheck.  He feels comfortable with discharge home and monitoring symptoms and returning if symptoms are worsening.   Patient denies any urinary symptoms though does not want to stay for UA  FINAL CLINICAL IMPRESSION(S) / ED DIAGNOSES   Final diagnoses:  Intractable headache, unspecified chronicity pattern, unspecified headache type  Nausea and vomiting, unspecified vomiting type     Rx / DC Orders   ED Discharge Orders          Ordered    ondansetron (ZOFRAN-ODT) 4 MG disintegrating tablet  Every 8 hours PRN        01/28/22 1457             Note:  This document was prepared using Dragon voice recognition software and may include  unintentional dictation errors.   Vanessa Salem, MD 01/28/22 [phone]

## 2022-01-28 NOTE — ED Triage Notes (Signed)
Pt here with CP and SOB that started yesterday. Pt states pain went away but has come back. Pain is generalized. Pt denies N/V/D.

## 2022-01-28 NOTE — Discharge Instructions (Addendum)
Please take ibuprofen 600 every 6-8 hours with food to help with any pain and you can also use a gram of Tylenol every 12 hours to help with pain.  We prescribe some Zofran to help with nausea.  Return to the ER if you develop fevers, worsening symptoms confusion or any other concerns.

## 2022-02-02 LAB — CULTURE, BLOOD (SINGLE): Culture: NO GROWTH

## 2022-02-17 ENCOUNTER — Other Ambulatory Visit: Payer: Self-pay | Admitting: Family Medicine

## 2022-02-17 DIAGNOSIS — M5020 Other cervical disc displacement, unspecified cervical region: Secondary | ICD-10-CM

## 2022-02-17 DIAGNOSIS — F39 Unspecified mood [affective] disorder: Secondary | ICD-10-CM

## 2022-02-17 NOTE — Telephone Encounter (Signed)
Requested medication (s) are due for refill today:yes  Requested medication (s) are on the active medication list: yes    Last refill: Valium   10/23/21  #50 3 refills      Hydrocodone  01/20/22  #120   0 refills  Future visit scheduled yes 03/02/22  Notes to clinic:Not delegated, please review. Thank you.  Requested Prescriptions  Pending Prescriptions Disp Refills   diazepam (VALIUM) 5 MG tablet 50 tablet 3    Sig: TAKE 2 TABLETS BY MOUTH ONCE DAILY IF NEEDED     Not Delegated - Psychiatry: Anxiolytics/Hypnotics 2 Failed - 02/17/2022  5:13 PM      Failed - This refill cannot be delegated      Failed - Urine Drug Screen completed in last 360 days      Failed - Valid encounter within last 6 months    Recent Outpatient Visits           7 months ago Non-healing wound of left lower extremity   [facility] Mikey Kirschner, PA-C   9 months ago Type 2 diabetes mellitus without complication, with long-term current use of insulin ([place])   [facility] Birdie Sons, MD   1 year ago Type 2 diabetes mellitus without complication, with long-term current use of insulin [facility])   [facility] Birdie Sons, MD   1 year ago [place], Donald E, MD   1 year ago Type 2 diabetes mellitus without complication, with long-term current use of insulin [facility])   [facility] Birdie Sons, MD       Future Appointments             In 1 week Fisher, Kirstie Peri, MD [facility] [place] - Patient is not pregnant       HYDROcodone-acetaminophen (NORCO) 10-325 MG tablet 120 tablet 0    Sig: Take 1 tablet by mouth every 6 (six) hours as needed.     Not Delegated - Analgesics:  Opioid Agonist Combinations Failed - 02/17/2022  5:13 PM      Failed - This refill cannot be delegated      Failed - Urine Drug Screen completed in last 360 days      Failed - Valid encounter  within last 3 months    Recent Outpatient Visits           7 months ago Non-healing wound of left lower extremity   [facility] Mikey Kirschner, PA-C   9 months ago Type 2 diabetes mellitus without complication, with long-term current use of insulin ([place])   [facility] Birdie Sons, MD   1 year ago Type 2 diabetes mellitus without complication, with long-term current use of insulin [facility])   [facility] Birdie Sons, MD   1 year ago [place], Donald E, MD   1 year ago Type 2 diabetes mellitus without complication, with long-term current use of insulin [facility])   [facility] Birdie Sons, MD       Future Appointments             In 1 week Fisher, Kirstie Peri, MD [facility] [place]

## 2022-02-17 NOTE — Telephone Encounter (Signed)
Medication Refill - Medication: HYDROcodone-acetaminophen (NORCO) 10-325 MG tablet  diazepam (VALIUM) 5 MG tablet  Has the patient contacted their pharmacy? No. (Agent: If no, request that the patient contact the pharmacy for the refill. If patient does not wish to contact the pharmacy document the reason why and proceed with request.)  Preferred Pharmacy (with phone number or street name):  [id number] [place] [address] [address] Phone:  [phone] Fax:  [phone]    Has the patient been seen for an appointment in the last year OR does the patient have an upcoming appointment? Yes.    Agent: Please be advised that RX refills may take up to 3 business days. We ask that you follow-up with your pharmacy.

## 2022-02-18 MED ORDER — DIAZEPAM 5 MG PO TABS: ORAL_TABLET | ORAL | 3 refills | Status: DC

## 2022-02-18 MED ORDER — HYDROCODONE-ACETAMINOPHEN 10-325 MG PO TABS: 1.0000 | ORAL_TABLET | Freq: Four times a day (QID) | ORAL | 0 refills | Status: DC | PRN

## 2022-03-02 ENCOUNTER — Encounter: Payer: Self-pay | Admitting: Family Medicine

## 2022-03-02 ENCOUNTER — Ambulatory Visit (INDEPENDENT_AMBULATORY_CARE_PROVIDER_SITE_OTHER): Payer: BC Managed Care – PPO | Admitting: Family Medicine

## 2022-03-02 VITALS — BP 136/78 | HR 88 | Temp 97.9°F | Resp 16 | Wt 170.4 lb

## 2022-03-02 DIAGNOSIS — I2510 Atherosclerotic heart disease of native coronary artery without angina pectoris: Secondary | ICD-10-CM

## 2022-03-02 DIAGNOSIS — Z794 Long term (current) use of insulin: Secondary | ICD-10-CM

## 2022-03-02 DIAGNOSIS — Z125 Encounter for screening for malignant neoplasm of prostate: Secondary | ICD-10-CM | POA: Diagnosis not present

## 2022-03-02 DIAGNOSIS — E119 Type 2 diabetes mellitus without complications: Secondary | ICD-10-CM

## 2022-03-02 DIAGNOSIS — I700 Atherosclerosis of aorta: Secondary | ICD-10-CM

## 2022-03-02 DIAGNOSIS — Z0000 Encounter for general adult medical examination without abnormal findings: Secondary | ICD-10-CM | POA: Diagnosis not present

## 2022-03-02 DIAGNOSIS — J439 Emphysema, unspecified: Secondary | ICD-10-CM

## 2022-03-02 NOTE — Patient Instructions (Signed)
Please review the attached list of medications and notify my office if there are any errors.   Please go to the lab draw station in [address] on the second floor of [facility]  when you are fasting for 8 hours. Normal hours are 8:00am to 11:30am and 1:00pm to 4:00pm Monday through Friday     
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
## 2022-03-03 LAB — MICROALBUMIN / CREATININE URINE RATIO
Creatinine, Urine: 102.6 mg/dL
Microalb/Creat Ratio: 54 mg/g creat — ABNORMAL HIGH (ref 0–29)
Microalbumin, Urine: 55 ug/mL

## 2022-03-03 LAB — SPECIMEN STATUS REPORT

## 2022-03-08 DIAGNOSIS — E119 Type 2 diabetes mellitus without complications: Secondary | ICD-10-CM | POA: Diagnosis not present

## 2022-03-08 DIAGNOSIS — Z125 Encounter for screening for malignant neoplasm of prostate: Secondary | ICD-10-CM | POA: Diagnosis not present

## 2022-03-08 DIAGNOSIS — Z794 Long term (current) use of insulin: Secondary | ICD-10-CM | POA: Diagnosis not present

## 2022-03-08 DIAGNOSIS — Z0000 Encounter for general adult medical examination without abnormal findings: Secondary | ICD-10-CM | POA: Diagnosis not present

## 2022-03-09 LAB — HEMOGLOBIN A1C
Est. average glucose Bld gHb Est-mCnc: 146 mg/dL
Hgb A1c MFr Bld: 6.7 % — ABNORMAL HIGH (ref 4.8–5.6)

## 2022-03-09 LAB — LIPID PANEL WITH LDL/HDL RATIO
Cholesterol, Total: 145 mg/dL (ref 100–199)
HDL: 52 mg/dL (ref >39)
LDL Chol Calc (NIH): 76 mg/dL (ref 0–99)
LDL/HDL Ratio: 1.5 ratio (ref 0.0–3.6)
Triglycerides: 87 mg/dL (ref 0–149)
VLDL Cholesterol Cal: 17 mg/dL (ref 5–40)

## 2022-03-09 LAB — PSA: Prostate Specific Ag, Serum: 0.6 ng/mL (ref 0.0–4.0)

## 2022-03-09 LAB — TSH: TSH: 0.69 u[IU]/mL (ref 0.450–4.500)

## 2022-03-22 ENCOUNTER — Other Ambulatory Visit: Payer: Self-pay | Admitting: Family Medicine

## 2022-03-25 ENCOUNTER — Other Ambulatory Visit: Payer: Self-pay | Admitting: Family Medicine

## 2022-03-25 DIAGNOSIS — M5020 Other cervical disc displacement, unspecified cervical region: Secondary | ICD-10-CM

## 2022-03-25 NOTE — Telephone Encounter (Signed)
Medication Refill - Medication: Hydrocodone 10-325  Has the patient contacted their pharmacy? Yes.   (Agent: If no, request that the patient contact the pharmacy for the refill. If patient does not wish to contact the pharmacy document the reason why and proceed with request.) (Agent: If yes, when and what did the pharmacy advise?)  Preferred Pharmacy (with phone number [facility] Has the patient been seen for an appointment in the last year OR does the patient have an upcoming appointment? Yes.    Agent: Please be advised that RX refills may take up to 3 business days. We ask that you follow-up with your pharmacy.

## 2022-03-26 MED ORDER — HYDROCODONE-ACETAMINOPHEN 10-325 MG PO TABS: 1.0000 | ORAL_TABLET | Freq: Four times a day (QID) | ORAL | 0 refills | Status: DC | PRN

## 2022-03-26 NOTE — Telephone Encounter (Signed)
Requested medication (s) are due for refill today:   Provider to review  Requested medication (s) are on the active medication list:   Yes  Future visit scheduled:   Yes in 3 mo.   Last ordered: 02/18/2022 #120, 0 refills  Non delegated refill per protocol   Requested Prescriptions  Pending Prescriptions Disp Refills   HYDROcodone-acetaminophen (NORCO) 10-325 MG tablet 120 tablet 0    Sig: Take 1 tablet by mouth every 6 (six) hours as needed.     Not Delegated - Analgesics:  Opioid Agonist Combinations Failed - 03/25/2022  4:11 PM      Failed - This refill cannot be delegated      Failed - Urine Drug Screen completed in last 360 days      Passed - Valid encounter within last 3 months    Recent Outpatient Visits           3 weeks ago Annual physical exam   [facility] Birdie Sons, MD   9 months ago Non-healing wound of left lower extremity   [facility] Thedore Mins, [place], PA-C   10 months ago Type 2 diabetes mellitus without complication, with long-term current use of insulin [facility])   [facility] Birdie Sons, MD   1 year ago Type 2 diabetes mellitus without complication, with long-term current use of insulin [facility])   [facility] Birdie Sons, MD   1 year ago [place], Donald E, MD       Future Appointments             In 3 months Fisher, Kirstie Peri, MD [facility], [place]

## 2022-04-26 ENCOUNTER — Other Ambulatory Visit: Payer: Self-pay | Admitting: Family Medicine

## 2022-04-26 DIAGNOSIS — M5020 Other cervical disc displacement, unspecified cervical region: Secondary | ICD-10-CM

## 2022-04-26 MED ORDER — HYDROCODONE-ACETAMINOPHEN 10-325 MG PO TABS: 1.0000 | ORAL_TABLET | Freq: Four times a day (QID) | ORAL | 0 refills | Status: DC | PRN

## 2022-05-20 DIAGNOSIS — L570 Actinic keratosis: Secondary | ICD-10-CM | POA: Diagnosis not present

## 2022-05-20 DIAGNOSIS — D485 Neoplasm of uncertain behavior of skin: Secondary | ICD-10-CM | POA: Diagnosis not present

## 2022-06-03 ENCOUNTER — Other Ambulatory Visit: Payer: Self-pay | Admitting: Family Medicine

## 2022-06-03 DIAGNOSIS — M5020 Other cervical disc displacement, unspecified cervical region: Secondary | ICD-10-CM

## 2022-06-03 NOTE — Telephone Encounter (Signed)
Medication Refill - Medication: HYDROcodone-acetaminophen (NORCO) 10-325 MG tablet  Has the patient contacted their pharmacy? Yes.   Pt out of med  (Agent: If no, request that the patient contact the pharmacy for the refill. If patient does not wish to contact the pharmacy document the reason why and proceed with request.) (Agent: If yes, when and what did the pharmacy advise?) Call dr   Preferred Pharmacy (with phone number or street name):  [id number] [id number] Phone: [phone]: [phone]  Has the patient been seen for an appointment in the last year OR does the patient have an upcoming appointment? Yes.   CPE 02/18/22  Agent: Please be advised that RX refills may take up to 3 business days. We ask that you follow-up with your pharmacy.

## 2022-06-04 MED ORDER — HYDROCODONE-ACETAMINOPHEN 10-325 MG PO TABS: 1.0000 | ORAL_TABLET | Freq: Four times a day (QID) | ORAL | 0 refills | Status: DC | PRN

## 2022-06-04 NOTE — Telephone Encounter (Signed)
Requested medication (s) are due for refill today: Yes  Requested medication (s) are on the active medication list: Yes  Last refill:  04/26/22  Future visit scheduled: Yes  Notes to clinic:  See request.    Requested Prescriptions  Pending Prescriptions Disp Refills   HYDROcodone-acetaminophen (NORCO) 10-325 MG tablet 120 tablet 0    Sig: Take 1 tablet by mouth every 6 (six) hours as needed.     Not Delegated - Analgesics:  Opioid Agonist Combinations Failed - 06/03/2022  5:07 PM      Failed - This refill cannot be delegated      Failed - Urine Drug Screen completed in last 360 days      Failed - Valid encounter within last 3 months    Recent Outpatient Visits           3 months ago Annual physical exam   [facility] Birdie Sons, MD   11 months ago Non-healing wound of left lower extremity   [facility] Mikey Kirschner, PA-C   1 year ago Type 2 diabetes mellitus without complication, with long-term current use of insulin [facility])   [facility] Birdie Sons, MD   1 year ago Type 2 diabetes mellitus without complication, with long-term current use of insulin [facility])   [facility] Birdie Sons, MD   1 year ago [place], Donald E, MD       Future Appointments             In 1 month Fisher, Kirstie Peri, MD [facility], [place]

## 2022-06-30 ENCOUNTER — Other Ambulatory Visit: Payer: Self-pay | Admitting: Family Medicine

## 2022-06-30 DIAGNOSIS — F39 Unspecified mood [affective] disorder: Secondary | ICD-10-CM

## 2022-06-30 NOTE — Telephone Encounter (Signed)
Requested medication (s) are due for refill today: yes  Requested medication (s) are on the active medication list:yes  Last refill:  02/18/22  Future visit scheduled: yes  Notes to clinic:  Unable to refill per protocol, cannot delegate.       Requested Prescriptions  Pending Prescriptions Disp Refills   diazepam (VALIUM) 5 MG tablet [Pharmacy Med Name: DIAZEPAM 5 MG TABLET] 50 tablet 3    Sig: TAKE 2 TABLETS BY MOUTH ONCE DAILY IF NEEDED     Not Delegated - Psychiatry: Anxiolytics/Hypnotics 2 Failed - 06/30/2022  9:57 AM      Failed - This refill cannot be delegated      Failed - Urine Drug Screen completed in last 360 days      Passed - Patient is not pregnant      Passed - Valid encounter within last 6 months    Recent Outpatient Visits           4 months ago Annual physical exam   [facility] Birdie Sons, MD   1 year ago Non-healing wound of left lower extremity   [facility] Mikey Kirschner, PA-C   1 year ago Type 2 diabetes mellitus without complication, with long-term current use of insulin [facility])   [facility] Birdie Sons, MD   1 year ago Type 2 diabetes mellitus without complication, with long-term current use of insulin [facility])   [facility] Birdie Sons, MD   1 year ago [place], Donald E, MD       Future Appointments             In 2 weeks Fisher, Kirstie Peri, MD [facility], [place]

## 2022-07-01 DIAGNOSIS — C44622 Squamous cell carcinoma of skin of right upper limb, including shoulder: Secondary | ICD-10-CM | POA: Diagnosis not present

## 2022-07-01 DIAGNOSIS — L988 Other specified disorders of the skin and subcutaneous tissue: Secondary | ICD-10-CM | POA: Diagnosis not present

## 2022-07-07 ENCOUNTER — Other Ambulatory Visit: Payer: Self-pay | Admitting: Family Medicine

## 2022-07-07 DIAGNOSIS — M5020 Other cervical disc displacement, unspecified cervical region: Secondary | ICD-10-CM

## 2022-07-07 NOTE — Telephone Encounter (Unsigned)
Copied from [place] [id number]. Topic: General - Other >> Jul 07, 2022  4:21 PM Everette C wrote: Reason for CRM: Medication Refill - Medication: HYDROcodone-acetaminophen (NORCO) 10-325 MG tablet [[id number]]   Has the patient contacted their pharmacy? No.   (Agent: If no, request that the patient contact the pharmacy for the refill. If patient does not wish to contact the pharmacy document the reason why and proceed with request.) (Agent: If yes, when and what did the pharmacy advise?)  Preferred Pharmacy (with phone number or street name[id number] [id number]: [phone]: [phone]: Not open 24 hours   Has the patient been seen for an appointment in the last year OR does the patient have an upcoming appointment? Yes.    Agent: Please be advised that RX refills may take up to 3 business days. We ask that you follow-up with your pharmacy.

## 2022-07-08 MED ORDER — HYDROCODONE-ACETAMINOPHEN 10-325 MG PO TABS: 1.0000 | ORAL_TABLET | Freq: Four times a day (QID) | ORAL | 0 refills | Status: DC | PRN

## 2022-07-08 NOTE — Telephone Encounter (Signed)
Requested medication (s) are due for refill today: yes  Requested medication (s) are on the active medication list: yes  Last refill:  06/04/22  Future visit scheduled: yes  Notes to clinic:  Unable to refill per protocol, cannot delegate.      Requested Prescriptions  Pending Prescriptions Disp Refills   HYDROcodone-acetaminophen (NORCO) 10-325 MG tablet 120 tablet 0    Sig: Take 1 tablet by mouth every 6 (six) hours as needed.     Not Delegated - Analgesics:  Opioid Agonist Combinations Failed - 07/07/2022  4:51 PM      Failed - This refill cannot be delegated      Failed - Urine Drug Screen completed in last 360 days      Failed - Valid encounter within last 3 months    Recent Outpatient Visits           4 months ago Annual physical exam   [facility] Birdie Sons, MD   1 year ago Non-healing wound of left lower extremity   [facility] Mikey Kirschner, PA-C   1 year ago Type 2 diabetes mellitus without complication, with long-term current use of insulin [facility])   [facility] Birdie Sons, MD   1 year ago Type 2 diabetes mellitus without complication, with long-term current use of insulin [facility])   [facility] Birdie Sons, MD   1 year ago [place], Donald E, MD       Future Appointments             In 1 week Fisher, Kirstie Peri, MD [facility], [place]

## 2022-07-19 ENCOUNTER — Ambulatory Visit: Payer: BC Managed Care – PPO | Admitting: Family Medicine

## 2022-07-19 ENCOUNTER — Encounter: Payer: Self-pay | Admitting: Family Medicine

## 2022-07-19 VITALS — BP 139/84 | HR 88 | Temp 98.4°F | Wt 176.0 lb

## 2022-07-19 DIAGNOSIS — M5020 Other cervical disc displacement, unspecified cervical region: Secondary | ICD-10-CM | POA: Diagnosis not present

## 2022-07-19 DIAGNOSIS — Z794 Long term (current) use of insulin: Secondary | ICD-10-CM | POA: Diagnosis not present

## 2022-07-19 DIAGNOSIS — E119 Type 2 diabetes mellitus without complications: Secondary | ICD-10-CM | POA: Diagnosis not present

## 2022-07-19 DIAGNOSIS — Z23 Encounter for immunization: Secondary | ICD-10-CM

## 2022-07-19 LAB — POCT GLYCOSYLATED HEMOGLOBIN (HGB A1C)
Est. average glucose Bld gHb Est-mCnc: 143
Hemoglobin A1C: 6.6 % — AB (ref 4.0–5.6)

## 2022-07-19 MED ORDER — GLIPIZIDE ER 2.5 MG PO TB24: 2.5000 mg | ORAL_TABLET | Freq: Every day | ORAL | 4 refills | Status: DC

## 2022-07-19 MED ORDER — HYDROCODONE-ACETAMINOPHEN 10-325 MG PO TABS: 1.0000 | ORAL_TABLET | Freq: Four times a day (QID) | ORAL | 0 refills | Status: DC | PRN

## 2022-07-19 NOTE — Progress Notes (Signed)
I,Roshena L Chambers,acting as a scribe for Lelon Huh, MD.,have documented all relevant documentation on the behalf of Lelon Huh, MD,as directed by  Lelon Huh, MD while in the presence of Lelon Huh, MD.   Established patient visit   Patient: Antonio Howe   DOB: Feb 18, 1961   61 y.o. Male  MRN: [id number] Visit Date: 07/19/2022  Today's healthcare provider: Lelon Huh, MD   No chief complaint on file.  Subjective    HPI  Diabetes Mellitus Type II, Follow-up  Lab Results  Component Value Date   HGBA1C 6.7 (H) 03/08/2022   HGBA1C 5.1 05/22/2021   HGBA1C 5.3 02/17/2021   Wt Readings from Last 3 Encounters:  07/19/22 176 lb (79.8 kg)  03/02/22 170 lb 6.4 oz (77.3 kg)  01/28/22 175 lb (79.4 kg)   Last seen for diabetes 5 months ago.  Management since then includes; continue medications.   Home blood sugar records: fasting range: 110-240 Most Recent Eye Exam: not UTD  Pertinent Labs: Lab Results  Component Value Date   CHOL 145 03/08/2022   HDL 52 03/08/2022   LDLCALC 76 03/08/2022   TRIG 87 03/08/2022   CHOLHDL 1.9 05/27/2021   Lab Results  Component Value Date   NA 135 01/28/2022   K 3.9 01/28/2022   CREATININE 1.11 01/28/2022   GFRNONAA >60 01/28/2022   MICROALBUR 50 07/04/2017   LABMICR 55.0 03/02/2022     ---------------------------------------------------------------------------------------------------   Medications: Outpatient Medications Prior to Visit  Medication Sig   aspirin 81 MG tablet Take 1 tablet by mouth daily.   B COMPLEX VITAMINS PO Take 1 capsule by mouth daily.   diazepam (VALIUM) 5 MG tablet TAKE 2 TABLETS BY MOUTH ONCE DAILY IF NEEDED   glipiZIDE (GLUCOTROL XL) 2.5 MG 24 hr tablet Take 1 tablet (2.5 mg total) by mouth daily.   glucose blood test strip 1 strip by Does not apply route daily.   HYDROcodone-acetaminophen (NORCO) 10-325 MG tablet Take 1 tablet by mouth every 6 (six) hours as needed.    lisinopril-hydrochlorothiazide (ZESTORETIC) 10-12.5 MG tablet TAKE 1 TABLET BY MOUTH EVERY DAY   No facility-administered medications prior to visit.    Review of Systems  Constitutional:  Negative for appetite change, chills and fever.  Respiratory:  Negative for chest tightness, shortness of breath and wheezing.   Cardiovascular:  Negative for chest pain and palpitations.  Gastrointestinal:  Negative for abdominal pain, nausea and vomiting.       Objective    Wt 176 lb (79.8 kg)   BMI 28.41 kg/m    Physical Exam   General: Appearance:    Well developed, well nourished male in no acute distress  Eyes:    PERRL, conjunctiva/corneas clear, EOM's intact       Lungs:     Clear to auscultation bilaterally, respirations unlabored  Heart:    Normal heart rate. Normal rhythm. No murmurs, rubs, or gallops.    MS:   All extremities are intact.    Neurologic:   Awake, alert, oriented x 3. No apparent focal neurological defect.        Lab Results  Component Value Date   HGBA1C 6.6 (A) 07/19/2022     Assessment & Plan     1. Type 2 diabetes mellitus without complication, with long-term current use of insulin (HCC) Well controlled.  Continue current medications.  refill glipiZIDE (GLUCOTROL XL) 2.5 MG 24 hr tablet; Take 1 tablet (2.5 mg total) by mouth daily.  Dispense: 90 tablet; Refill: 4  2. Herniated cervical disc Pain adequately controlled on current medications. refill- HYDROcodone-acetaminophen (NORCO) 10-325 MG tablet; Take 1 tablet by mouth every 6 (six) hours as needed.  Dispense: 120 tablet; Refill: 0  3. Need for immunization against influenza  - Flu Vaccine QUAD 74moIM (Fluarix, Fluzone & Alfiuria Quad PF)   Future Appointments  Date Time Provider DSan [place] 03/07/2023  3:00 PM FCaryn SectionDKirstie Peri MD [facility]       The entirety of the information documented in the History of Present Illness, Review of Systems and Physical Exam were personally obtained by  me. Portions of this information were initially documented by the CMA and reviewed by me for thoroughness and accuracy.     DLelon Huh MD  [phone](phone) [phone](fax)  [place]

## 2022-07-22 DIAGNOSIS — D225 Melanocytic nevi of trunk: Secondary | ICD-10-CM | POA: Diagnosis not present

## 2022-07-22 DIAGNOSIS — L570 Actinic keratosis: Secondary | ICD-10-CM | POA: Diagnosis not present

## 2022-07-22 DIAGNOSIS — L578 Other skin changes due to chronic exposure to nonionizing radiation: Secondary | ICD-10-CM | POA: Diagnosis not present

## 2022-07-22 DIAGNOSIS — L738 Other specified follicular disorders: Secondary | ICD-10-CM | POA: Diagnosis not present

## 2022-08-10 ENCOUNTER — Telehealth: Payer: Self-pay | Admitting: Family Medicine

## 2022-08-10 DIAGNOSIS — M5020 Other cervical disc displacement, unspecified cervical region: Secondary | ICD-10-CM

## 2022-08-10 NOTE — Telephone Encounter (Signed)
[facility] ([place]) 10-325 MG tablet and would like prescription sent to    [facility]  [address], [place], [place] [address] [phone]   Patient would like request expedited because he was unable to sleep last night.

## 2022-08-11 MED ORDER — HYDROCODONE-ACETAMINOPHEN 10-325 MG PO TABS: 1.0000 | ORAL_TABLET | Freq: Four times a day (QID) | ORAL | 0 refills | Status: DC | PRN

## 2022-08-11 NOTE — Addendum Note (Signed)
Addended by: Birdie Sons on: 08/11/2022 08:05 AM   Modules accepted: Orders

## 2022-09-08 ENCOUNTER — Other Ambulatory Visit: Payer: Self-pay | Admitting: Family Medicine

## 2022-09-08 DIAGNOSIS — M5020 Other cervical disc displacement, unspecified cervical region: Secondary | ICD-10-CM

## 2022-09-08 NOTE — Telephone Encounter (Signed)
Medication Refill - Medication: HYDROcodone-acetaminophen (NORCO) 10-325 MG tablet [[id number]]    Has the patient contacted their pharmacy? No. (Agent: If no, request that the patient contact the pharmacy for the refill. If patient does not wish to contact the pharmacy document the reason why and proceed with request.) (Agent: If yes, when and what did the pharmacy advise?)  Preferred Pharmacy (with phone number or street name): [id number] - [place], [place] [address] [place]  Has the patient been seen for an appointment in the last year OR does the patient have an upcoming appointment? Yes.    Agent: Please be advised that RX refills may take up to 3 business days. We ask that you follow-up with your pharmacy.

## 2022-09-09 MED ORDER — HYDROCODONE-ACETAMINOPHEN 10-325 MG PO TABS: 1.0000 | ORAL_TABLET | Freq: Four times a day (QID) | ORAL | 0 refills | Status: DC | PRN

## 2022-09-09 NOTE — Telephone Encounter (Signed)
Requested medication (s) are due for refill today: Due 09/10/22  Requested medication (s) are on the active medication list: yes    Last refill: 11/29/223  #120  0 refills  Future visit scheduled yes 03/07/23  Notes to clinic:Not delegated, please review. Thank you.  Requested Prescriptions  Pending Prescriptions Disp Refills   HYDROcodone-acetaminophen (NORCO) 10-325 MG tablet 120 tablet 0    Sig: Take 1 tablet by mouth every 6 (six) hours as needed.     Not Delegated - Analgesics:  Opioid Agonist Combinations Failed - 09/09/2022  8:15 AM      Failed - This refill cannot be delegated      Failed - Urine Drug Screen completed in last 360 days      Passed - Valid encounter within last 3 months    Recent Outpatient Visits           1 month ago Type 2 diabetes mellitus without complication, with long-term current use of insulin [facility])   [facility] Birdie Sons, MD   6 months ago Annual physical exam   [facility] Birdie Sons, MD   1 year ago Non-healing wound of left lower extremity   [facility] Mikey Kirschner, PA-C   1 year ago Type 2 diabetes mellitus without complication, with long-term current use of insulin [facility])   [facility] Birdie Sons, MD   1 year ago Type 2 diabetes mellitus without complication, with long-term current use of insulin [facility])   [facility] Birdie Sons, MD       Future Appointments             In 5 months Fisher, Kirstie Peri, MD [facility], [facility]

## 2022-09-20 DIAGNOSIS — M9901 Segmental and somatic dysfunction of cervical region: Secondary | ICD-10-CM | POA: Diagnosis not present

## 2022-09-20 DIAGNOSIS — M62838 Other muscle spasm: Secondary | ICD-10-CM | POA: Diagnosis not present

## 2022-09-20 DIAGNOSIS — M542 Cervicalgia: Secondary | ICD-10-CM | POA: Diagnosis not present

## 2022-09-20 DIAGNOSIS — M5413 Radiculopathy, cervicothoracic region: Secondary | ICD-10-CM | POA: Diagnosis not present

## 2022-10-11 ENCOUNTER — Other Ambulatory Visit: Payer: Self-pay | Admitting: Family Medicine

## 2022-10-11 DIAGNOSIS — M5020 Other cervical disc displacement, unspecified cervical region: Secondary | ICD-10-CM

## 2022-10-11 DIAGNOSIS — F39 Unspecified mood [affective] disorder: Secondary | ICD-10-CM

## 2022-10-11 NOTE — Telephone Encounter (Signed)
Medication Refill - Medication: HYDROcodone-acetaminophen (NORCO) 10-325 MG tablet and diazepam (VALIUM) 5 MG tablet   Has the patient contacted their pharmacy? No. Pt previously told to contact provider  Preferred Pharmacy (with phone number or street name):  [id number] - [place], [place] [address] AT [place] Phone: [phone]  Fax: [phone]     Has the patient been seen for an appointment in the last year OR does the patient have an upcoming appointment? Yes.    Agent: Please be advised that RX refills may take up to 3 business days. We ask that you follow-up with your pharmacy.

## 2022-10-12 NOTE — Telephone Encounter (Signed)
Requested medication (s) are due for refill today: yes  Requested medication (s) are on the active medication list: yes  Last refill:  diazepam 06/30/22 #50/3, Hydrocodone 09/09/22 #120/0  Future visit scheduled: yes  Notes to clinic:  Unable to refill per protocol, cannot delegate.    Requested Prescriptions  Pending Prescriptions Disp Refills   diazepam (VALIUM) 5 MG tablet 50 tablet 3     Not Delegated - Psychiatry: Anxiolytics/Hypnotics 2 Failed - 10/11/2022  1:13 PM      Failed - This refill cannot be delegated      Failed - Urine Drug Screen completed in last 360 days      Passed - Patient is not pregnant      Passed - Valid encounter within last 6 months    Recent Outpatient Visits           2 months ago Type 2 diabetes mellitus without complication, with long-term current use of insulin ([place])   [place] Birdie Sons, MD   7 months ago Annual physical exam   [facility] Birdie Sons, MD   1 year ago Non-healing wound of left lower extremity   [place] Mikey Kirschner, PA-C   1 year ago Type 2 diabetes mellitus without complication, with long-term current use of insulin ([place])   [place], Donald E, MD   1 year ago Type 2 diabetes mellitus without complication, with long-term current use of insulin ([place])   [place], MD       Future Appointments             In 4 months Fisher, Kirstie Peri, MD [facility], [facility]             HYDROcodone-acetaminophen (NORCO) 10-325 MG tablet 120 tablet 0    Sig: Take 1 tablet by mouth every 6 (six) hours as needed.     Not Delegated - Analgesics:  Opioid Agonist Combinations Failed - 10/11/2022  1:13 PM      Failed - This refill cannot be delegated      Failed - Urine Drug Screen completed in last 360 days      Passed - Valid  encounter within last 3 months    Recent Outpatient Visits           2 months ago Type 2 diabetes mellitus without complication, with long-term current use of insulin ([place])   [place] Birdie Sons, MD   7 months ago Annual physical exam   [facility] Birdie Sons, MD   1 year ago Non-healing wound of left lower extremity   [place] Mikey Kirschner, PA-C   1 year ago Type 2 diabetes mellitus without complication, with long-term current use of insulin [facility])   [place], Donald E, MD   1 year ago Type 2 diabetes mellitus without complication, with long-term current use of insulin ([place])   [place], Donald E, MD       Future Appointments             In 4 months Fisher, Kirstie Peri, MD [facility], [facility]

## 2022-10-13 ENCOUNTER — Ambulatory Visit: Admission: RE | Admit: 2022-10-13 | Payer: BC Managed Care – PPO | Source: Ambulatory Visit

## 2022-10-14 MED ORDER — DIAZEPAM 5 MG PO TABS: ORAL_TABLET | ORAL | 3 refills | Status: DC

## 2022-10-14 MED ORDER — HYDROCODONE-ACETAMINOPHEN 10-325 MG PO TABS: 1.0000 | ORAL_TABLET | Freq: Four times a day (QID) | ORAL | 0 refills | Status: DC | PRN

## 2022-10-25 ENCOUNTER — Ambulatory Visit: Payer: BC Managed Care – PPO

## 2022-10-31 IMAGING — CT CT HEAD W/O CM
4 series · 16 of 47 positions shown, 18 images · non-contrast
Comparison: None Available.

CLINICAL DATA: Persistent/recurrent dizziness.



[Series 2: head bone · axial · 0.45mm/px · z∈[-60,-28]mm · 3 of 80 slices shown]
[im 8/80  bone]
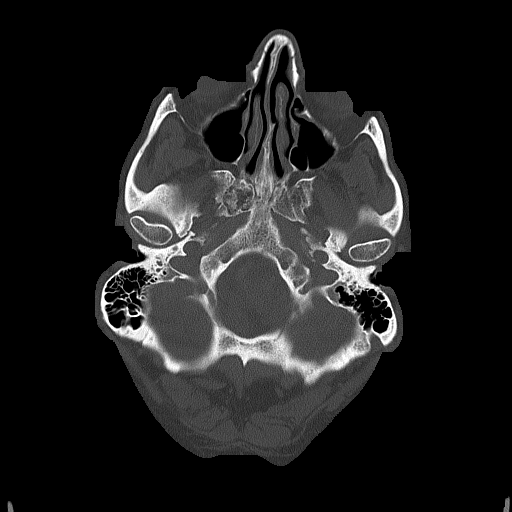
[im 16/80  bone]
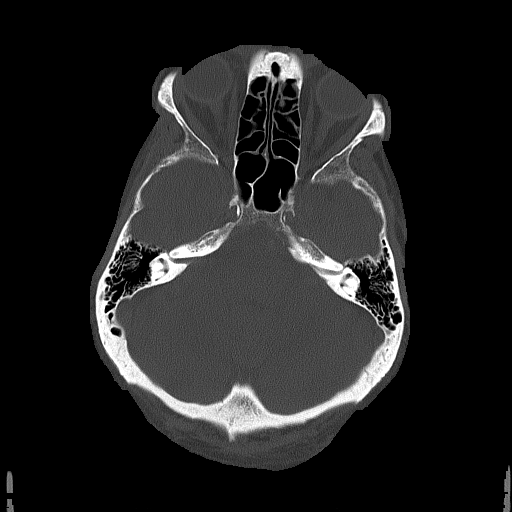
[im 24/80  bone]
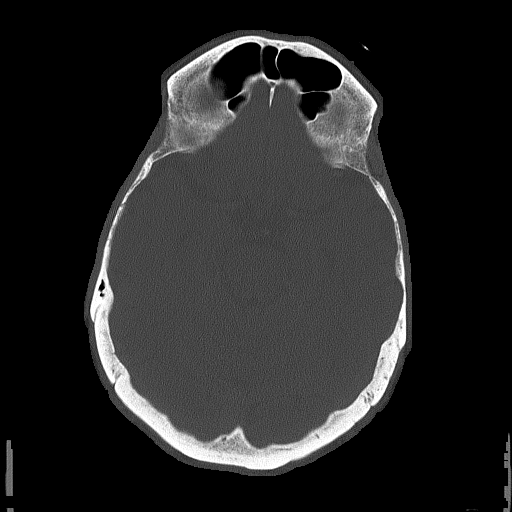

[Series 3: head wo · axial · 0.45mm/px · z∈[-59,+61]mm · 7 of 32 slices shown, 9 images]
[im 4/32  brain]
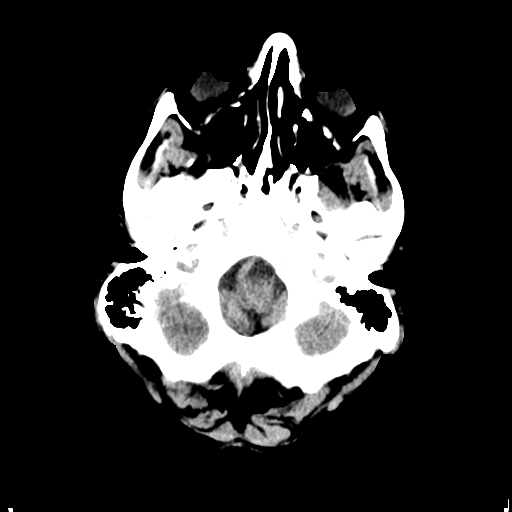
[im 4/32  bone]
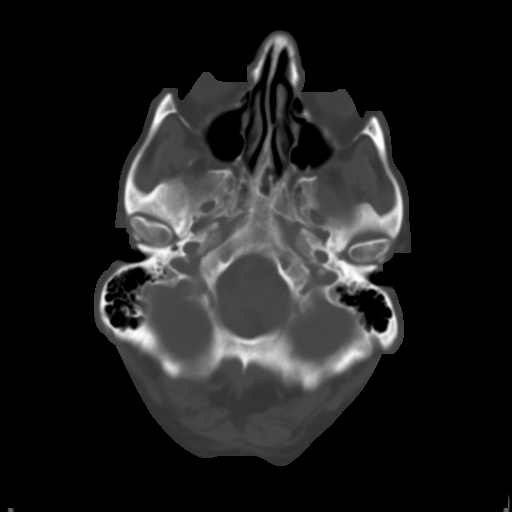
[im 8/32  brain]
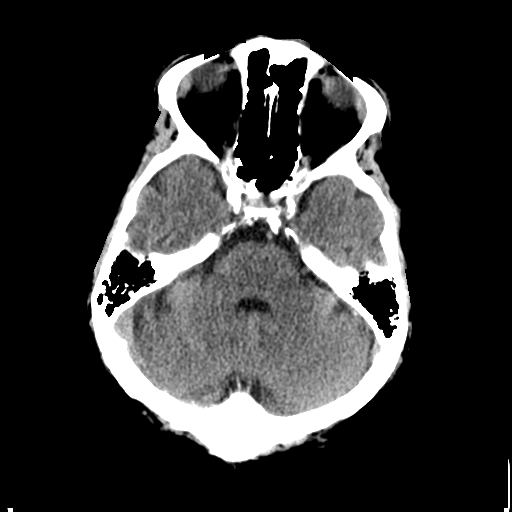
[im 12/32  brain]
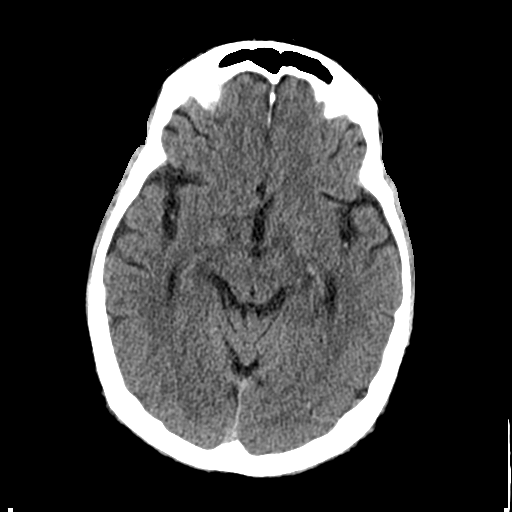
[im 16/32  brain]
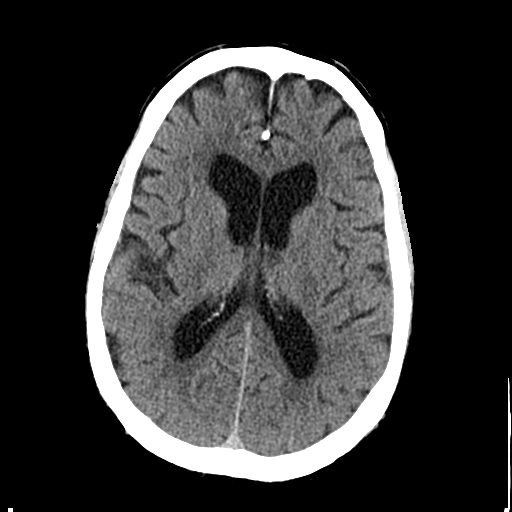
[im 20/32  brain]
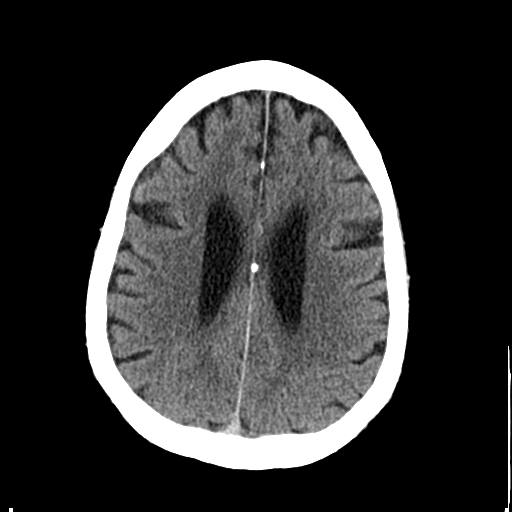
[im 20/32  bone]
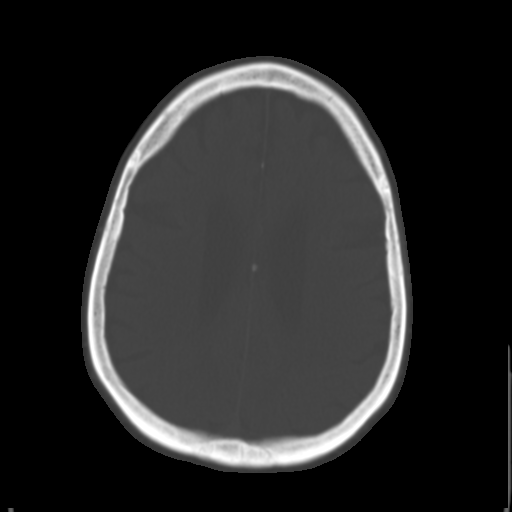
[im 24/32  brain]
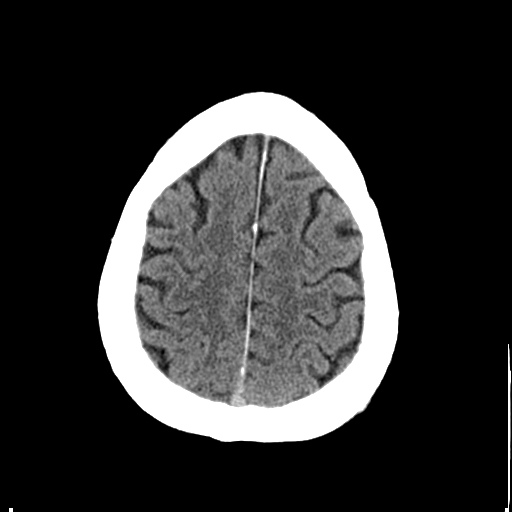
[im 28/32  brain]
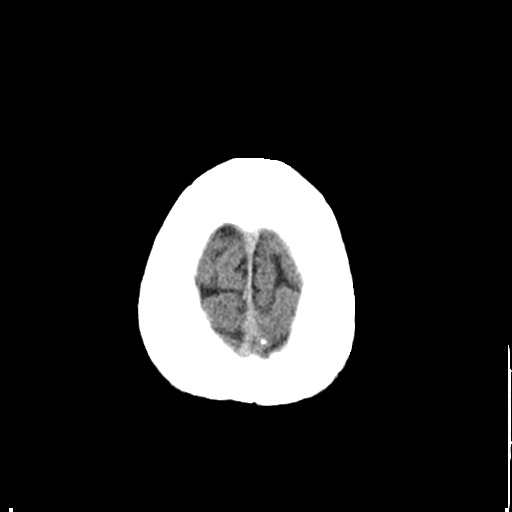

[Series 4: coronal soft tissue · coronal · 0.36mm/px · 3 of 71 slices shown]
[im 24/71  brain]
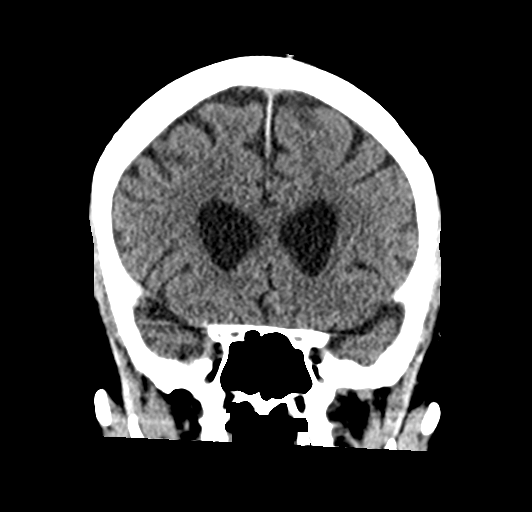
[im 32/71  brain]
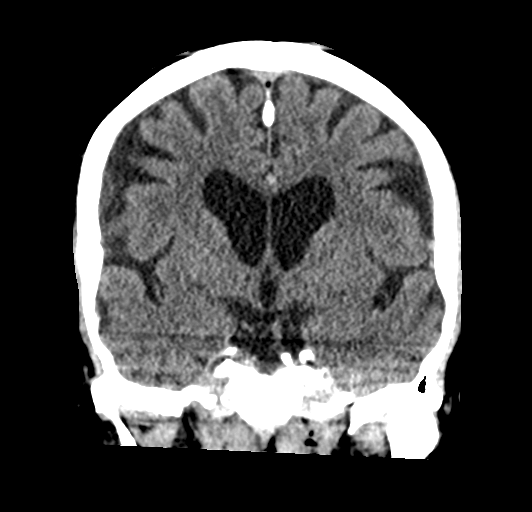
[im 39/71  brain]
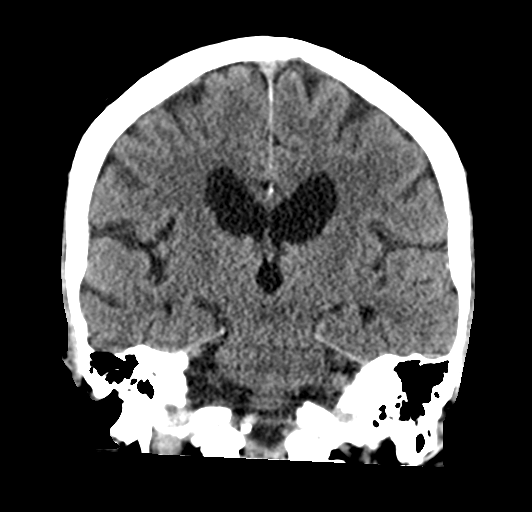

[Series 5: sagittal soft tissue · sagittal · 0.36mm/px · 3 of 55 slices shown]
[im 19/55  brain]
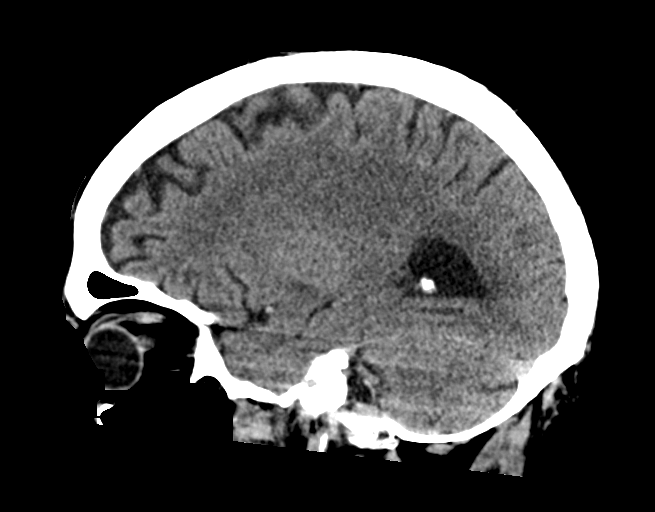
[im 28/55  brain]
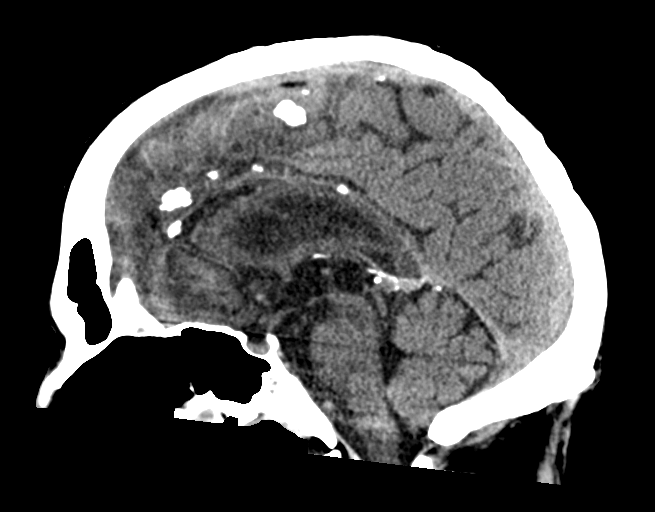
[im 37/55  brain]
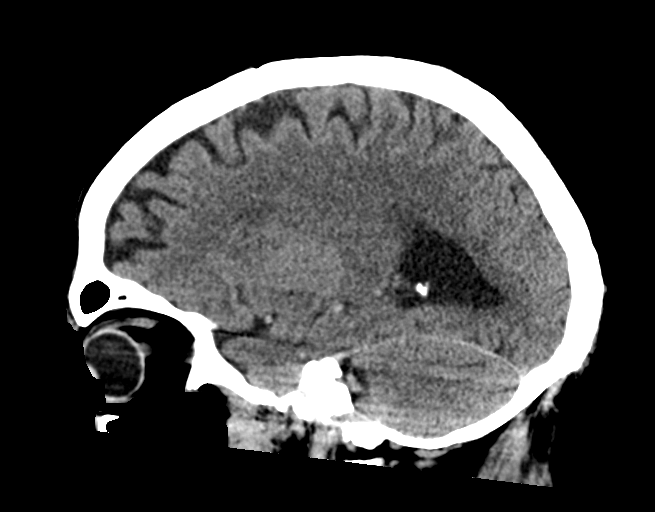

[16 of 47 positions shown; findings below may reference images not displayed]

FINDINGS: Brain: No evidence of acute large vascular territory infarction,
hemorrhage, hydrocephalus, extra-axial collection or mass
lesion/mass effect. Dense dural calcifications. Mild age related
global parenchymal volume loss. Mild burden of chronic ischemic
white matter disease.

Vascular: No hyperdense vessel. Atherosclerotic calcifications of
the internal carotid and vertebral arteries at the skull base.

Skull: Normal. Negative for fracture or focal lesion.

Sinuses/Orbits: Visualized portions of the paranasal sinuses are
predominantly clear. Orbits are unremarkable.

Other: Mastoid air cells are predominantly clear.
IMPRESSION: No acute intracranial abnormality.

## 2022-10-31 IMAGING — CR DG CHEST 2V
1 series · 2 of 2 positions shown · non-contrast
Comparison: Chest radiograph 08/10/2020

CLINICAL DATA: Chest pain and shortness of breath starting
yesterday

EXAM:
CHEST - 2 VIEW

[Series 1: w chest pa · 0.14mm/px · 2 of 2 slices shown]
[im 1/2]
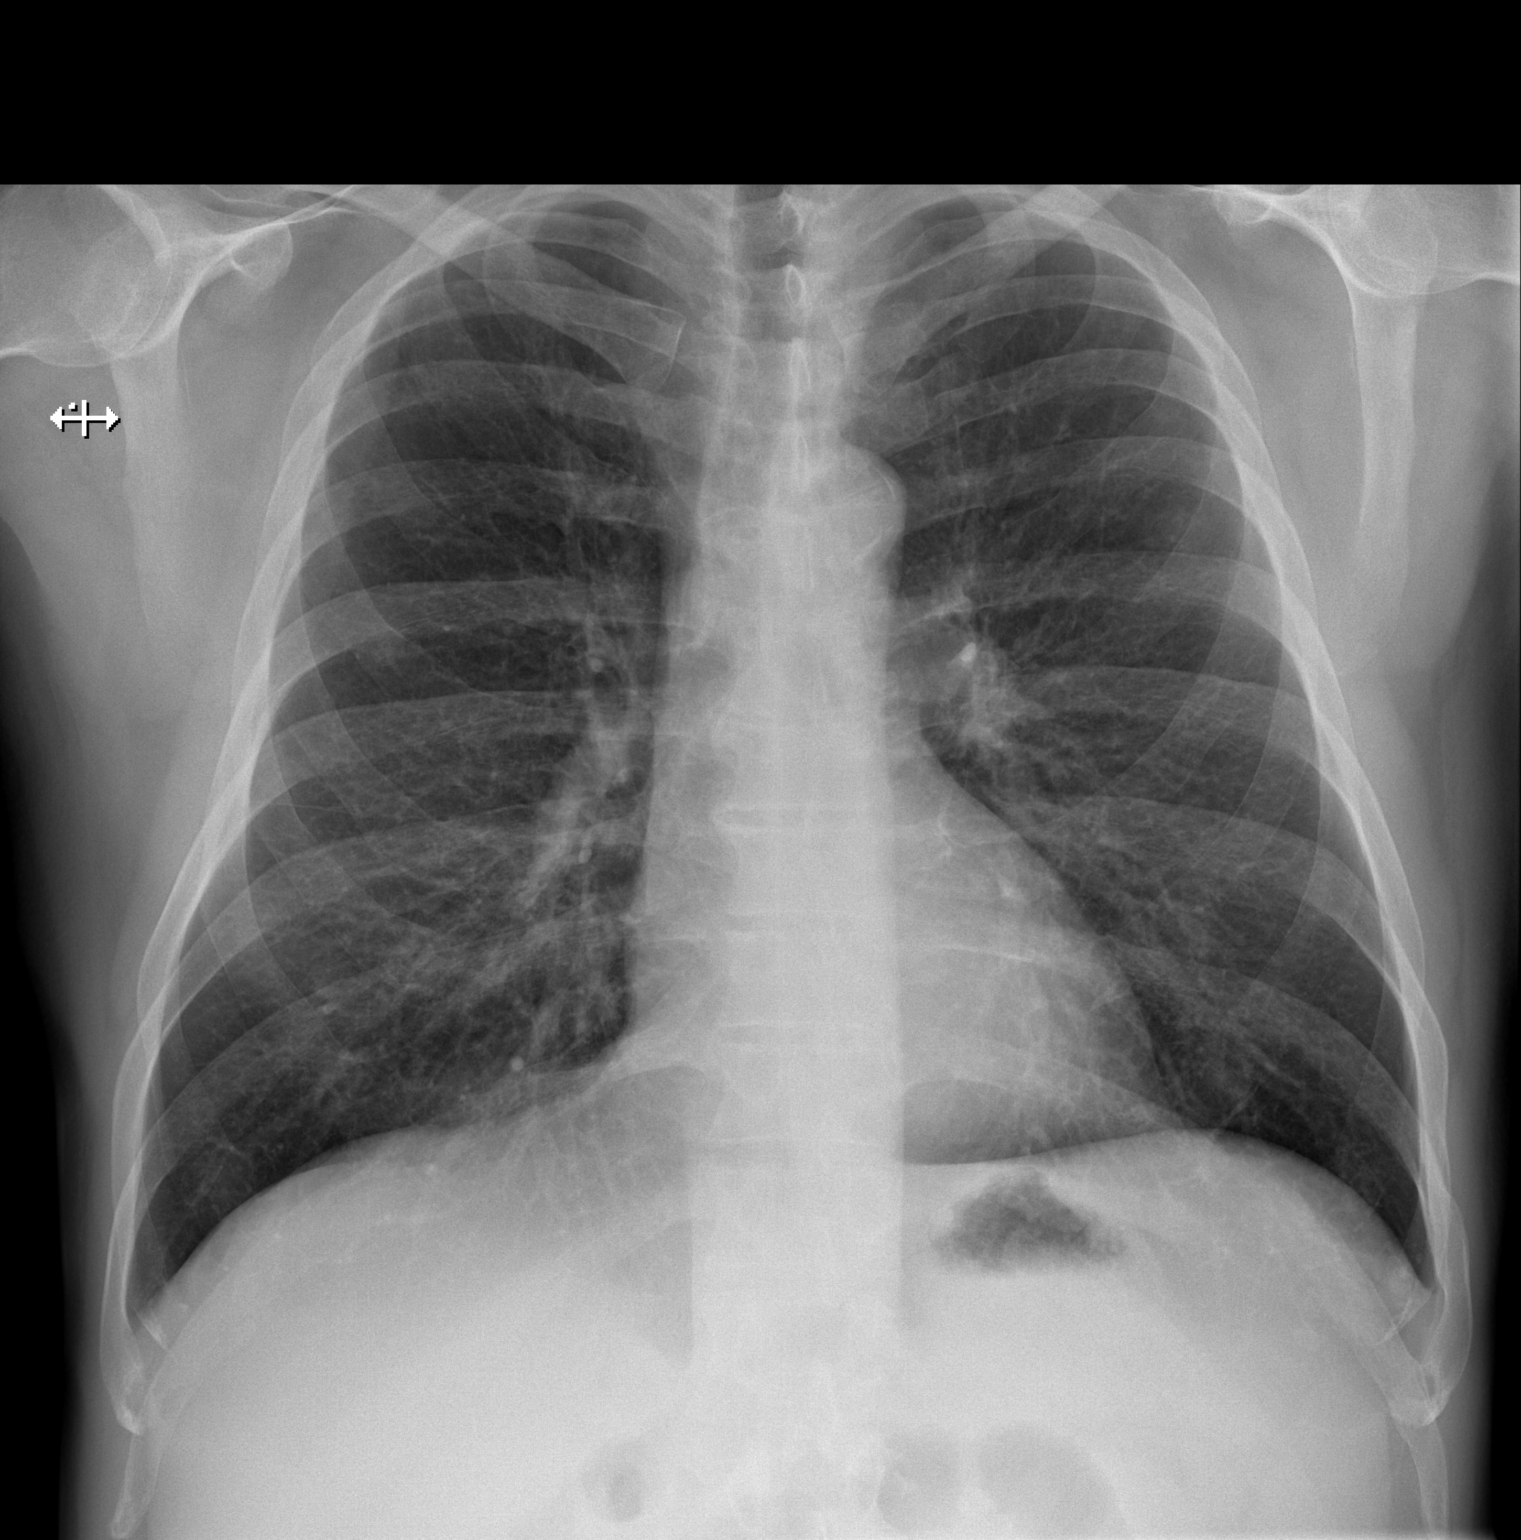
[im 2/2]
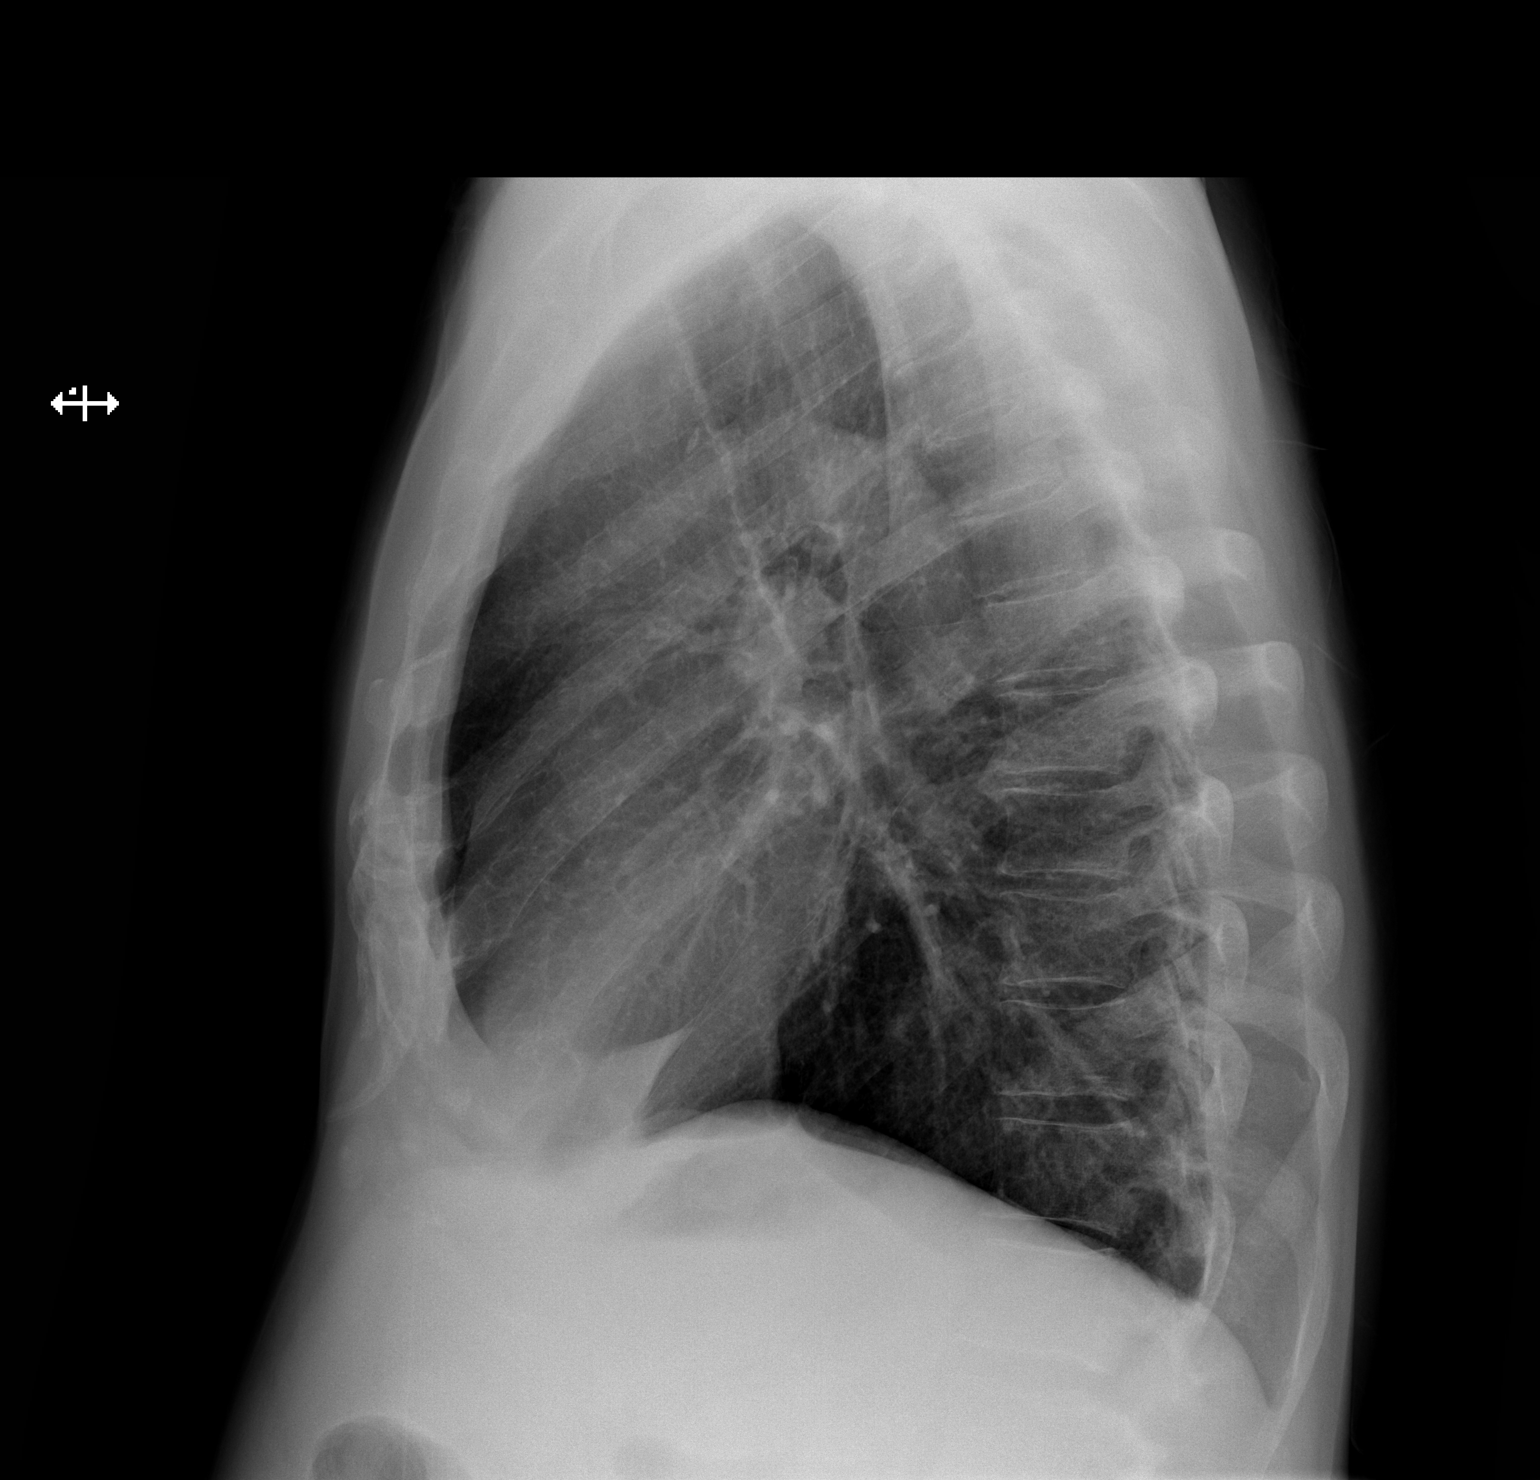

[2 of 2 positions shown; findings below may reference images not displayed]

FINDINGS: The cardiomediastinal silhouette is normal.

There is no focal consolidation or pulmonary edema. There is no
pleural effusion or pneumothorax

There is no acute osseous abnormality. Cervical spine fusion
hardware is again noted.
IMPRESSION: No radiographic evidence of acute cardiopulmonary process.

## 2022-11-11 ENCOUNTER — Other Ambulatory Visit: Payer: Self-pay | Admitting: Family Medicine

## 2022-11-11 DIAGNOSIS — F39 Unspecified mood [affective] disorder: Secondary | ICD-10-CM

## 2022-11-11 DIAGNOSIS — M5020 Other cervical disc displacement, unspecified cervical region: Secondary | ICD-10-CM

## 2022-11-11 DIAGNOSIS — E119 Type 2 diabetes mellitus without complications: Secondary | ICD-10-CM

## 2022-11-11 MED ORDER — HYDROCODONE-ACETAMINOPHEN 10-325 MG PO TABS: 1.0000 | ORAL_TABLET | Freq: Four times a day (QID) | ORAL | 0 refills | Status: DC | PRN

## 2022-11-11 MED ORDER — GLIPIZIDE ER 2.5 MG PO TB24: 2.5000 mg | ORAL_TABLET | Freq: Every day | ORAL | 4 refills | Status: DC

## 2022-11-11 MED ORDER — LISINOPRIL-HYDROCHLOROTHIAZIDE 10-12.5 MG PO TABS: 1.0000 | ORAL_TABLET | Freq: Every day | ORAL | 4 refills | Status: DC

## 2022-11-11 MED ORDER — DIAZEPAM 5 MG PO TABS: ORAL_TABLET | ORAL | 3 refills | Status: DC

## 2022-11-11 NOTE — Telephone Encounter (Signed)
Medication Refill - Medication: glipiZIDE (GLUCOTROL XL) 2.5 MG 24 hr tablet [id number]   HYDROcodone-acetaminophen (NORCO) 10-325 MG tablet [id number]   lisinopril-hydrochlorothiazide (ZESTORETIC) 10-12.5 MG tablet [id number]   diazepam (VALIUM) 5 MG tablet [id number]    Has the patient contacted their pharmacy? Yes.   (Agent: If no, request that the patient contact the pharmacy for the refill. If patient does not wish to contact the pharmacy document the reason why and proceed with request.) (Agent: If yes, when and what did the pharmacy advise?)  Preferred Pharmacy (with phone number or street name): [facility] [place], [place] [address] [place], [place] [address] Phone: [phone]  Fax: [phone]  Has the patient been seen for an appointment in the last year OR does the patient have an upcoming appointment? Yes.    Agent: Please be advised that RX refills may take up to 3 business days. We ask that you follow-up with your pharmacy.

## 2022-11-11 NOTE — Telephone Encounter (Signed)
Requested medication (s) are due for refill today: no  Requested medication (s) are on the active medication list: yes  Last refill:  multiple dates  Future visit scheduled: yes  Notes to clinic:  Unable to refill per protocol, cannot delegate. Requests are too soon to be refilled. Unable to refuse, routing for approval.      Requested Prescriptions  Pending Prescriptions Disp Refills   diazepam (VALIUM) 5 MG tablet 50 tablet 3    Sig: TAKE 2 TABLETS BY MOUTH ONCE DAILY IF NEEDED     Not Delegated - Psychiatry: Anxiolytics/Hypnotics 2 Failed - 11/11/2022 11:16 AM      Failed - This refill cannot be delegated      Failed - Urine Drug Screen completed in last 360 days      Passed - Patient is not pregnant      Passed - Valid encounter within last 6 months    Recent Outpatient Visits           3 months ago Type 2 diabetes mellitus without complication, with long-term current use of insulin ([place])   [place] Caryn [place], Kirstie Peri, MD   8 months ago Annual physical exam   [facility] Birdie Sons, MD   1 year ago Non-healing wound of left lower extremity   [place] Mikey Kirschner, PA-C   1 year ago Type 2 diabetes mellitus without complication, with long-term current use of insulin ([place])   [place], Donald E, MD   1 year ago Type 2 diabetes mellitus without complication, with long-term current use of insulin ([place])   [place], MD       Future Appointments             In 3 months Fisher, Kirstie Peri, MD [place], [facility]             glipiZIDE (GLUCOTROL XL) 2.5 MG 24 hr tablet 90 tablet 4    Sig: Take 1 tablet (2.5 mg total) by mouth daily.     Endocrinology:  Diabetes - Sulfonylureas Passed - 11/11/2022 11:16 AM      Passed - HBA1C is between 0 and 7.9 and within 180 days     Hemoglobin A1C  Date Value Ref Range Status  07/19/2022 6.6 (A) 4.0 - 5.6 % Final   Hgb A1c MFr Bld  Date Value Ref Range Status  03/08/2022 6.7 (H) 4.8 - 5.6 % Final    Comment:             Prediabetes: 5.7 - 6.4          Diabetes: >6.4          Glycemic control for adults with diabetes: <7.0          Passed - Cr in normal range and within 360 days    Creatinine, Ser  Date Value Ref Range Status  01/28/2022 1.11 0.61 - 1.24 mg/dL Final   Creatinine, POC  Date Value Ref Range Status  07/04/2017 n/a mg/dL Final         Passed - Valid encounter within last 6 months    Recent Outpatient Visits           3 months ago Type 2 diabetes mellitus without complication, with long-term current use of insulin ([place])   [place] [facility] Birdie Sons, MD  8 months ago Annual physical exam   [facility] Birdie Sons, MD   1 year ago Non-healing wound of left lower extremity   [place] Mikey Kirschner, PA-C   1 year ago Type 2 diabetes mellitus without complication, with long-term current use of insulin ([place])   [place], Donald E, MD   1 year ago Type 2 diabetes mellitus without complication, with long-term current use of insulin ([place])   [place] Birdie Sons, MD       Future Appointments             In 3 months Fisher, Kirstie Peri, MD [facility]             HYDROcodone-acetaminophen (NORCO) 10-325 MG tablet 120 tablet 0    Sig: Take 1 tablet by mouth every 6 (six) hours as needed.     Not Delegated - Analgesics:  Opioid Agonist Combinations Failed - 11/11/2022 11:16 AM      Failed - This refill cannot be delegated      Failed - Urine Drug Screen completed in last 360 days      Failed - Valid encounter within last 3 months    Recent Outpatient Visits           3 months ago Type 2  diabetes mellitus without complication, with long-term current use of insulin ([place])   [place] Birdie Sons, MD   8 months ago Annual physical exam   [facility] Birdie Sons, MD   1 year ago Non-healing wound of left lower extremity   [place] Mikey Kirschner, PA-C   1 year ago Type 2 diabetes mellitus without complication, with long-term current use of insulin ([place])   [place], Donald E, MD   1 year ago Type 2 diabetes mellitus without complication, with long-term current use of insulin ([place])   [place], MD       Future Appointments             In 3 months Fisher, Kirstie Peri, MD [facility], [facility]             lisinopril-hydrochlorothiazide (ZESTORETIC) 10-12.5 MG tablet 90 tablet 4    Sig: Take 1 tablet by mouth daily.     Cardiovascular:  ACEI + Diuretic Combos Failed - 11/11/2022 11:16 AM      Failed - Na in normal range and within 180 days    Sodium  Date Value Ref Range Status  01/28/2022 135 135 - 145 mmol/L Final  05/27/2021 138 134 - 144 mmol/L Final         Failed - K in normal range and within 180 days    Potassium  Date Value Ref Range Status  01/28/2022 3.9 3.5 - 5.1 mmol/L Final         Failed - Cr in normal range and within 180 days    Creatinine, Ser  Date Value Ref Range Status  01/28/2022 1.11 0.61 - 1.24 mg/dL Final   Creatinine, POC  Date Value Ref Range Status  07/04/2017 n/a mg/dL Final         Failed - eGFR is 30 or above and within 180 days    GFR calc Af Amer  Date Value Ref  Range Status  04/02/2020 109 >59 mL/min/1.73 Final    Comment:    **[facility] currently reports eGFR in compliance with the current**   recommendations of the [employer]. [facility] will   update reporting as new guidelines are published from the NKF-ASN    Task force.    GFR, Estimated  Date Value Ref Range Status  01/28/2022 >60 >60 mL/min Final    Comment:    (NOTE) Calculated using the CKD-EPI Creatinine Equation (2021)    eGFR  Date Value Ref Range Status  05/27/2021 89 >59 mL/min/1.73 Final         Passed - Patient is not pregnant      Passed - Last BP in normal range    BP Readings from Last 1 Encounters:  07/19/22 139/84         Passed - Valid encounter within last 6 months    Recent Outpatient Visits           3 months ago Type 2 diabetes mellitus without complication, with long-term current use of insulin ([place])   [place] Birdie Sons, MD   8 months ago Annual physical exam   [facility] Birdie Sons, MD   1 year ago Non-healing wound of left lower extremity   [place] Mikey Kirschner, PA-C   1 year ago Type 2 diabetes mellitus without complication, with long-term current use of insulin ([place])   [place], Donald E, MD   1 year ago Type 2 diabetes mellitus without complication, with long-term current use of insulin ([place])   [place], Donald E, MD       Future Appointments             In 3 months Fisher, Kirstie Peri, MD [facility], [facility]

## 2022-12-13 ENCOUNTER — Other Ambulatory Visit: Payer: Self-pay | Admitting: Family Medicine

## 2022-12-13 DIAGNOSIS — M5020 Other cervical disc displacement, unspecified cervical region: Secondary | ICD-10-CM

## 2022-12-13 DIAGNOSIS — F39 Unspecified mood [affective] disorder: Secondary | ICD-10-CM

## 2022-12-13 NOTE — Telephone Encounter (Signed)
Medication Refill - Medication: Hydrocodone 10/325// /////Diazipam 5 mg  Has the patient contacted their pharmacy? Yes.   (Agent: If no, request that the patient contact the pharmacy for the refill. If patient does not wish to contact the pharmacy document the reason why and proceed with request.) (Agent: If yes, when and what did the pharmacy advise?)  Preferred Pharmacy (with phone number or street name): [facility]  [place] Has the patient been seen for an appointment in the last year OR does the patient have an upcoming appointment? Yes.    Agent: Please be advised that RX refills may take up to 3 business days. We ask that you follow-up with your pharmacy.

## 2022-12-14 MED ORDER — DIAZEPAM 5 MG PO TABS: ORAL_TABLET | ORAL | 3 refills | Status: DC

## 2022-12-14 MED ORDER — HYDROCODONE-ACETAMINOPHEN 10-325 MG PO TABS: 1.0000 | ORAL_TABLET | Freq: Four times a day (QID) | ORAL | 0 refills | Status: DC | PRN

## 2022-12-14 NOTE — Telephone Encounter (Signed)
Requested medication (s) are due for refill today: yes  Requested medication (s) are on the active medication list: yes  Last refill:  11/11/22  Future visit scheduled: yes  Notes to clinic:  Unable to refill per protocol, cannot delegate.      Requested Prescriptions  Pending Prescriptions Disp Refills   HYDROcodone-acetaminophen (NORCO) 10-325 MG tablet 120 tablet 0    Sig: Take 1 tablet by mouth every 6 (six) hours as needed.     Not Delegated - Analgesics:  Opioid Agonist Combinations Failed - 12/13/2022  3:41 PM      Failed - This refill cannot be delegated      Failed - Urine Drug Screen completed in last 360 days      Failed - Valid encounter within last 3 months    Recent Outpatient Visits           4 months ago Type 2 diabetes mellitus without complication, with long-term current use of insulin ([place])   [place] Caryn Section, Kirstie Peri, MD   9 months ago Annual physical exam   [facility] Birdie Sons, MD   1 year ago Non-healing wound of left lower extremity   [place] Mikey Kirschner, PA-C   1 year ago Type 2 diabetes mellitus without complication, with long-term current use of insulin ([place])   [place], Donald E, MD   1 year ago Type 2 diabetes mellitus without complication, with long-term current use of insulin ([place])   [place], MD       Future Appointments             In 2 months Fisher, Kirstie Peri, MD [place], [facility]             diazepam (VALIUM) 5 MG tablet 50 tablet 3    Sig: TAKE 2 TABLETS BY MOUTH ONCE DAILY IF NEEDED     Not Delegated - Psychiatry: Anxiolytics/Hypnotics 2 Failed - 12/13/2022  3:41 PM      Failed - This refill cannot be delegated      Failed - Urine Drug Screen completed in last 360 days      Passed - Patient is not pregnant      Passed  - Valid encounter within last 6 months    Recent Outpatient Visits           4 months ago Type 2 diabetes mellitus without complication, with long-term current use of insulin ([place])   [place] Birdie Sons, MD   9 months ago Annual physical exam   [facility] Birdie Sons, MD   1 year ago Non-healing wound of left lower extremity   [place] Mikey Kirschner, PA-C   1 year ago Type 2 diabetes mellitus without complication, with long-term current use of insulin ([place])   [place], Donald E, MD   1 year ago Type 2 diabetes mellitus without complication, with long-term current use of insulin ([place])   [place], Donald E, MD       Future Appointments             In 2 months Fisher, Kirstie Peri, MD [facility], [facility]

## 2022-12-29 DIAGNOSIS — L578 Other skin changes due to chronic exposure to nonionizing radiation: Secondary | ICD-10-CM | POA: Diagnosis not present

## 2022-12-29 DIAGNOSIS — L738 Other specified follicular disorders: Secondary | ICD-10-CM | POA: Diagnosis not present

## 2022-12-29 DIAGNOSIS — Z859 Personal history of malignant neoplasm, unspecified: Secondary | ICD-10-CM | POA: Diagnosis not present

## 2022-12-29 DIAGNOSIS — Z872 Personal history of diseases of the skin and subcutaneous tissue: Secondary | ICD-10-CM | POA: Diagnosis not present

## 2023-01-10 ENCOUNTER — Other Ambulatory Visit: Payer: Self-pay | Admitting: Family Medicine

## 2023-01-10 DIAGNOSIS — M5020 Other cervical disc displacement, unspecified cervical region: Secondary | ICD-10-CM

## 2023-01-10 DIAGNOSIS — F39 Unspecified mood [affective] disorder: Secondary | ICD-10-CM

## 2023-01-10 NOTE — Telephone Encounter (Signed)
Medication Refill - Medication: HYDROcodone-acetaminophen (NORCO) 10-325 MG tablet [[id number]] diazepam (VALIUM) 5 MG tablet [[id number]]   Has the patient contacted their pharmacy? Yes.    (Agent: If yes, when and what did the pharmacy advise?)Contact PCP   Preferred Pharmacy (with phone number or street name): [id number] - [place], [place] - [address] [place] [address]   Has the patient been seen for an appointment in the last year OR does the patient have an upcoming appointment? Yes.    Agent: Please be advised that RX refills may take up to 3 business days. We ask that you follow-up with your pharmacy.

## 2023-01-11 NOTE — Telephone Encounter (Signed)
Requested medication (s) are due for refill today: Norco yes. Valium no  Requested medication (s) are on the active medication list: yes    Last refill: Norco  4//2  #120  0 refills   Diazepam 12/14/22  #50  3 refills  Future visit scheduled yes 03/07/23  Notes to clinic:Not delegated,please review. Thank you.  Requested Prescriptions  Pending Prescriptions Disp Refills   HYDROcodone-acetaminophen (NORCO) 10-325 MG tablet 120 tablet 0    Sig: Take 1 tablet by mouth every 6 (six) hours as needed.     Not Delegated - Analgesics:  Opioid Agonist Combinations Failed - 01/10/2023  5:05 PM      Failed - This refill cannot be delegated      Failed - Urine Drug Screen completed in last 360 days      Failed - Valid encounter within last 3 months    Recent Outpatient Visits           5 months ago Type 2 diabetes mellitus without complication, with long-term current use of insulin (HCC)   [place] [facility] Sherrie Mustache, Demetrios Isaacs, MD   10 months ago Annual physical exam   [facility] Malva Limes, MD   1 year ago Non-healing wound of left lower extremity   [facility] Alfredia Ferguson, PA-C   1 year ago Type 2 diabetes mellitus without complication, with long-term current use of insulin (HCC)   [place] [facility] Malva Limes, MD   1 year ago Type 2 diabetes mellitus without complication, with long-term current use of insulin (HCC)   [place] [facility] Sherrie Mustache, Demetrios Isaacs, MD       Future Appointments             In 1 month Fisher, Demetrios Isaacs, MD [facility], [facility]             diazepam (VALIUM) 5 MG tablet 50 tablet 3    Sig: TAKE 2 TABLETS BY MOUTH ONCE DAILY IF NEEDED     Not Delegated - Psychiatry: Anxiolytics/Hypnotics 2 Failed - 01/10/2023  5:05 PM      Failed - This refill cannot be delegated      Failed - Urine Drug Screen completed in  last 360 days      Passed - Patient is not pregnant      Passed - Valid encounter within last 6 months    Recent Outpatient Visits           5 months ago Type 2 diabetes mellitus without complication, with long-term current use of insulin (HCC)   [facility] Sherrie Mustache, Demetrios Isaacs, MD   10 months ago Annual physical exam   [facility] Malva Limes, MD   1 year ago Non-healing wound of left lower extremity   [place] [facility] Alfredia Ferguson, PA-C   1 year ago Type 2 diabetes mellitus without complication, with long-term current use of insulin (HCC)   [place] [facility] Malva Limes, MD   1 year ago Type 2 diabetes mellitus without complication, with long-term current use of insulin (HCC)   [place] [facility] Malva Limes, MD       Future Appointments             In 1 month Fisher, Demetrios Isaacs, MD [facility], [facility]

## 2023-01-12 MED ORDER — DIAZEPAM 5 MG PO TABS
ORAL_TABLET | ORAL | 3 refills | Status: DC
Start: 2023-01-12 — End: 2023-02-14

## 2023-01-12 MED ORDER — HYDROCODONE-ACETAMINOPHEN 10-325 MG PO TABS: 1.0000 | ORAL_TABLET | Freq: Four times a day (QID) | ORAL | 0 refills | Status: DC | PRN

## 2023-02-14 ENCOUNTER — Other Ambulatory Visit: Payer: Self-pay | Admitting: Family Medicine

## 2023-02-14 DIAGNOSIS — M5020 Other cervical disc displacement, unspecified cervical region: Secondary | ICD-10-CM

## 2023-02-14 DIAGNOSIS — F39 Unspecified mood [affective] disorder: Secondary | ICD-10-CM

## 2023-02-14 NOTE — Telephone Encounter (Signed)
Medication Refill - Medication: diazepam (VALIUM) 5 MG tablet /HYDROcodone-acetaminophen (NORCO) 10-325 MG tablet   Has the patient contacted their pharmacy? yes (Agent: If no, request that the patient contact the pharmacy for the refill. If patient does not wish to contact the pharmacy document the reason why and proceed with request.) (Agent: If yes, when and what did the pharmacy advise?)contact pcp  Preferred Pharmacy (with phone number or street name):  [id number] - [place],  - [address] [address] AT [facility] [address] [facility] [address] Phone: [phone]  Fax: [phone]     Has the patient been seen for an appointment in the last year OR does the patient have an upcoming appointment? yes  Agent: Please be advised that RX refills may take up to 3 business days. We ask that you follow-up with your pharmacy.

## 2023-02-15 NOTE — Telephone Encounter (Signed)
Requested medications are due for refill today.  unsure  Requested medications are on the active medications list.  yes  Last refill. Both refilled 01/12/2023  Future visit scheduled.   yes  Notes to clinic.  Refills not delegated    Requested Prescriptions  Pending Prescriptions Disp Refills   diazepam (VALIUM) 5 MG tablet 50 tablet 3    Sig: TAKE 2 TABLETS BY MOUTH ONCE DAILY IF NEEDED     Not Delegated - Psychiatry: Anxiolytics/Hypnotics 2 Failed - 02/14/2023  2:15 PM      Failed - This refill cannot be delegated      Failed - Urine Drug Screen completed in last 360 days      Failed - Valid encounter within last 6 months    Recent Outpatient Visits           7 months ago Type 2 diabetes mellitus without complication, with long-term current use of insulin (HCC)   [place] [facility] Malva Limes, MD   11 months ago Annual physical exam   [facility] Malva Limes, MD   1 year ago Non-healing wound of left lower extremity   [place] [facility] Alfredia Ferguson, PA-C   1 year ago Type 2 diabetes mellitus without complication, with long-term current use of insulin (HCC)   [place] [facility] Malva Limes, MD   1 year ago Type 2 diabetes mellitus without complication, with long-term current use of insulin (HCC)   [place] [facility] Malva Limes, MD       Future Appointments             In 3 weeks Fisher, Demetrios Isaacs, MD [facility]            Passed - Patient is not pregnant       HYDROcodone-acetaminophen (NORCO) 10-325 MG tablet 120 tablet 0    Sig: Take 1 tablet by mouth every 6 (six) hours as needed.     Not Delegated - Analgesics:  Opioid Agonist Combinations Failed - 02/14/2023  2:15 PM      Failed - This refill cannot be delegated      Failed - Urine Drug Screen completed in last 360 days      Failed - Valid  encounter within last 3 months    Recent Outpatient Visits           7 months ago Type 2 diabetes mellitus without complication, with long-term current use of insulin (HCC)   [place] [facility] Malva Limes, MD   11 months ago Annual physical exam   [facility] Malva Limes, MD   1 year ago Non-healing wound of left lower extremity   [place] [facility] Alfredia Ferguson, PA-C   1 year ago Type 2 diabetes mellitus without complication, with long-term current use of insulin [facility])   [place] [facility] Malva Limes, MD   1 year ago Type 2 diabetes mellitus without complication, with long-term current use of insulin (HCC)   [place] [facility] Malva Limes, MD       Future Appointments             In 3 weeks Fisher, Demetrios Isaacs, MD [facility]

## 2023-02-16 ENCOUNTER — Telehealth: Payer: Self-pay

## 2023-02-16 MED ORDER — HYDROCODONE-ACETAMINOPHEN 10-325 MG PO TABS
1.0000 | ORAL_TABLET | Freq: Four times a day (QID) | ORAL | 0 refills | Status: DC | PRN
Start: 2023-02-16 — End: 2023-03-14

## 2023-02-16 MED ORDER — DIAZEPAM 5 MG PO TABS
ORAL_TABLET | ORAL | 3 refills | Status: DC
Start: 2023-02-16 — End: 2023-04-22

## 2023-02-16 NOTE — Telephone Encounter (Signed)
Copied from CRM [id number]. Topic: General - Other >> Feb 16, 2023  1:26 PM Dondra Prader E wrote: Reason for CRM: Pt called back to check status of his refill requests, he says he will be completley out of his current supply of both Rxs tomorrow. Please advise

## 2023-03-07 ENCOUNTER — Encounter: Payer: BC Managed Care – PPO | Admitting: Family Medicine

## 2023-03-14 ENCOUNTER — Other Ambulatory Visit: Payer: Self-pay | Admitting: Family Medicine

## 2023-03-14 ENCOUNTER — Encounter: Payer: Self-pay | Admitting: Family Medicine

## 2023-03-14 ENCOUNTER — Ambulatory Visit (INDEPENDENT_AMBULATORY_CARE_PROVIDER_SITE_OTHER): Payer: BC Managed Care – PPO | Admitting: Family Medicine

## 2023-03-14 VITALS — BP 168/86 | HR 83 | Ht 66.0 in | Wt 178.0 lb

## 2023-03-14 DIAGNOSIS — J439 Emphysema, unspecified: Secondary | ICD-10-CM

## 2023-03-14 DIAGNOSIS — I10 Essential (primary) hypertension: Secondary | ICD-10-CM

## 2023-03-14 DIAGNOSIS — Z23 Encounter for immunization: Secondary | ICD-10-CM | POA: Diagnosis not present

## 2023-03-14 DIAGNOSIS — F17210 Nicotine dependence, cigarettes, uncomplicated: Secondary | ICD-10-CM

## 2023-03-14 DIAGNOSIS — I700 Atherosclerosis of aorta: Secondary | ICD-10-CM

## 2023-03-14 DIAGNOSIS — E1129 Type 2 diabetes mellitus with other diabetic kidney complication: Secondary | ICD-10-CM | POA: Diagnosis not present

## 2023-03-14 DIAGNOSIS — R809 Proteinuria, unspecified: Secondary | ICD-10-CM

## 2023-03-14 DIAGNOSIS — M5020 Other cervical disc displacement, unspecified cervical region: Secondary | ICD-10-CM

## 2023-03-14 DIAGNOSIS — Z125 Encounter for screening for malignant neoplasm of prostate: Secondary | ICD-10-CM

## 2023-03-14 DIAGNOSIS — Z0000 Encounter for general adult medical examination without abnormal findings: Secondary | ICD-10-CM

## 2023-03-14 DIAGNOSIS — I2510 Atherosclerotic heart disease of native coronary artery without angina pectoris: Secondary | ICD-10-CM

## 2023-03-14 DIAGNOSIS — M25511 Pain in right shoulder: Secondary | ICD-10-CM

## 2023-03-14 DIAGNOSIS — F39 Unspecified mood [affective] disorder: Secondary | ICD-10-CM

## 2023-03-14 MED ORDER — HYDROCODONE-ACETAMINOPHEN 10-325 MG PO TABS
1.0000 | ORAL_TABLET | Freq: Four times a day (QID) | ORAL | 0 refills | Status: DC | PRN
Start: 2023-03-14 — End: 2023-04-22

## 2023-03-14 NOTE — Patient Instructions (Addendum)
Please review the attached list of medications and notify my office if there are any errors.   I recommend the following orthopedists to follow up on your neck and shoulder   Kennedy Bucker - [facility]   Ninfa Linden, MD - [facility]   Juanell Fairly - [facility]

## 2023-03-14 NOTE — Telephone Encounter (Signed)
Medication Refill - Medication: HYDROcodone-acetaminophen (NORCO) 10-325 MG tablet   Has the patient contacted their pharmacy? Yes.   No, refills.   (Agent: If yes, when and what did the pharmacy advise?)  Preferred Pharmacy (with phone number or street name):  [id number], [place] - [address] AT [facility] OF [address]  [address] [place] [address]  Phone: [phone] Fax: [phone]  Hours: Not open 24 hours   Has the patient been seen for an appointment in the last year OR does the patient have an upcoming appointment? Yes.    Agent: Please be advised that RX refills may take up to 3 business days. We ask that you follow-up with your pharmacy.

## 2023-03-14 NOTE — Progress Notes (Signed)
Established patient visit   Patient: Antonio Howe   DOB: September 21, 1960   62 y.o. Male  MRN: [id number] Visit Date: 03/14/2023  Today's healthcare provider: Mila Merry, MD   Chief Complaint  Patient presents with   Annual Exam   Subjective    Discussed the use of AI scribe software for clinical note transcription with the patient, who gave verbal consent to proceed.  History of Present Illness   The patient, nearing retirement age, presents for routine physical and follow up of multiple chronic medical problems. He also reports persistent shoulder pain that began in January after an overextension injury at work. The pain is localized to the upper shoulder area and is described as bothersome but not severe enough to cause work absences. Workers comp sent him to see Dr. Noralyn Pick at [facility]. He reports an MRI of neck and shoulder was performed, and an EMG suggested possible carpal tunnel syndrome due to some numbness in the fingers. However, the patient feels the pain is more localized to the shoulder area. He does have known history of cervical disk disease with prior cervical fusion The patient reports dissatisfaction with the care received at his workplace and is seeking a second opinion from an orthopedic specialist.  The patient also reports elevated blood pressure, attributing it to a stressful work environment. He is currently on Lisinopril for blood pressure management and Glipizide for blood sugar control. The patient admits to not monitoring these levels as regularly as he should. His last A1c in November was 6.6%  The patient has a >20 pack year history of smoking, currently reduced to three to four cigarettes a day, and admits to daily beer consumption. He has had two lung scans in the past and is due for another.  The patient also has a history of 3V coronary artery disease and aortic atherosclerosis, identified in a previous  LDCT scan. He is currently on daily aspirin  therapy. The patient is due for a tetanus booster and has regular dermatology check-ups with [facility] due to previous skin concerns.  The patient is planning to retire soon and is in the process of understanding his Medicare options. He expresses a desire to improve his health after retirement.       Medications: Outpatient Medications Prior to Visit  Medication Sig   aspirin 81 MG tablet Take 1 tablet by mouth daily.   B COMPLEX VITAMINS PO Take 1 capsule by mouth daily.   diazepam (VALIUM) 5 MG tablet TAKE 2 TABLETS BY MOUTH ONCE DAILY IF NEEDED   glipiZIDE (GLUCOTROL XL) 2.5 MG 24 hr tablet Take 1 tablet (2.5 mg total) by mouth daily.   glucose blood test strip 1 strip by Does not apply route daily.   HYDROcodone-acetaminophen (NORCO) 10-325 MG tablet Take 1 tablet by mouth every 6 (six) hours as needed.   lisinopril-hydrochlorothiazide (ZESTORETIC) 10-12.5 MG tablet Take 1 tablet by mouth daily.   meloxicam (MOBIC) 15 MG tablet Take 15 mg by mouth daily.   No facility-administered medications prior to visit.   Review of Systems  Constitutional:  Negative for appetite change, chills, fatigue and fever.  Respiratory:  Negative for chest tightness, shortness of breath and wheezing.   Cardiovascular:  Negative for chest pain and palpitations.  Gastrointestinal:  Negative for abdominal pain, nausea and vomiting.  Musculoskeletal:  Positive for neck pain and neck stiffness. Negative for arthralgias and back pain.       Objective    BP Marland Kitchen)  168/86   Pulse 83   Ht 5\' 6"  (1.676 m)   Wt 178 lb (80.7 kg)   SpO2 98%   BMI 28.73 kg/m    Physical Exam   General Appearance:    Well developed, well nourished male. Alert, cooperative, in no acute distress, appears stated age  Head:    Normocephalic, without obvious abnormality, atraumatic  Eyes:    PERRL, conjunctiva/corneas clear, EOM's intact, fundi    benign, both eyes       Ears:    Normal TM's and external ear canals, both  ears  Nose:   Nares normal, septum midline, mucosa normal, no drainage   or sinus tenderness  Throat:   Lips, mucosa, and tongue normal; teeth and gums normal  Neck:   Supple, symmetrical, trachea midline, no adenopathy;       thyroid:  No enlargement/tenderness/nodules; no carotid   bruit or JVD  Back:     Symmetric, no curvature, ROM normal, no CVA tenderness  Lungs:     Clear to auscultation bilaterally, respirations unlabored  Chest wall:    No tenderness or deformity  Heart:    Normal heart rate. Normal rhythm. No murmurs, rubs, or gallops.  S1 and S2 normal  Abdomen:     Soft, non-tender, bowel sounds active all four quadrants,    no masses, no organomegaly  Genitalia:    deferred  Rectal:    deferred  Extremities:   All extremities are intact. No cyanosis or edema  Pulses:   2+ and symmetric all extremities  Skin:   Skin color, texture, turgor normal, no rashes or lesions  Lymph nodes:   Cervical, supraclavicular, and axillary nodes normal  Neurologic:   CNII-XII intact. Normal strength, sensation and reflexes      throughout     Assessment & Plan     1. Annual physical exam   2. Need for tetanus, diphtheria, and acellular pertussis (Tdap) vaccine in patient of adolescent age or older  - Administer Tetanus-diphtheria-acellular pertussis (Tdap) vaccine  3. Prostate cancer screening  - PSA Total (Reflex To Free) ([facility] only)  4. Smoking greater than 30 pack years Is working on smoking cessation.  - Ambulatory Referral for Lung Cancer Screening [REF832]  5. Essential (primary) hypertension Uncontrolled, likely aggravated by work related stress. Consider increasing lisinopril-hydrochlorothiazide if remains elevated.  - CBC - Comprehensive metabolic panel  6. Type 2 diabetes mellitus with diabetic microalbuminuria, with long-term current use of insulin (HCC)  - Urine microalbumin-creatinine with uACR - Lipid panel - Hemoglobin A1c  7. Aortic atherosclerosis  (HCC) Asymptomatic. Compliant with medication.  Continue aggressive risk factor modification.   - Lipid panel  8. Atherosclerosis of coronary artery of native heart without angina pectoris, unspecified vessel or lesion type Noted on LDCT. Counseled that he likely needs statin. Will see how lipids look first.   9. Pulmonary emphysema, unspecified emphysema type (HCC) Encouraged smoking cessation  10. Mood disorder (HCC) Does well with occasional diazepam  11. Right shoulder pain, unspecified chronicity  - meloxicam (MOBIC) 15 MG tablet; Take 15 mg by mouth daily.          The entirety of the information documented in the History of Present Illness, Review of Systems and Physical Exam were personally obtained by me. Portions of this information were initially documented by the CMA and reviewed by me for thoroughness and accuracy.    Mila Merry, MD  [facility] [phone] (phone) [phone] (fax)  [facility]

## 2023-03-15 DIAGNOSIS — Z794 Long term (current) use of insulin: Secondary | ICD-10-CM | POA: Diagnosis not present

## 2023-03-15 DIAGNOSIS — E1129 Type 2 diabetes mellitus with other diabetic kidney complication: Secondary | ICD-10-CM | POA: Diagnosis not present

## 2023-03-15 DIAGNOSIS — I10 Essential (primary) hypertension: Secondary | ICD-10-CM | POA: Diagnosis not present

## 2023-03-15 DIAGNOSIS — I700 Atherosclerosis of aorta: Secondary | ICD-10-CM | POA: Diagnosis not present

## 2023-03-15 DIAGNOSIS — R809 Proteinuria, unspecified: Secondary | ICD-10-CM | POA: Diagnosis not present

## 2023-03-15 DIAGNOSIS — Z125 Encounter for screening for malignant neoplasm of prostate: Secondary | ICD-10-CM | POA: Diagnosis not present

## 2023-03-16 LAB — COMPREHENSIVE METABOLIC PANEL
ALT: 35 IU/L (ref 0–44)
AST: 19 IU/L (ref 0–40)
Albumin: 4.6 g/dL (ref 3.9–4.9)
Alkaline Phosphatase: 64 IU/L (ref 44–121)
BUN/Creatinine Ratio: 16 (ref 10–24)
BUN: 18 mg/dL (ref 8–27)
Bilirubin Total: 1 mg/dL (ref 0.0–1.2)
CO2: 23 mmol/L (ref 20–29)
Calcium: 9.7 mg/dL (ref 8.6–10.2)
Chloride: 99 mmol/L (ref 96–106)
Creatinine, Ser: 1.1 mg/dL (ref 0.76–1.27)
Globulin, Total: 2.3 g/dL (ref 1.5–4.5)
Glucose: 162 mg/dL — ABNORMAL HIGH (ref 70–99)
Potassium: 5.6 mmol/L — ABNORMAL HIGH (ref 3.5–5.2)
Sodium: 137 mmol/L (ref 134–144)
Total Protein: 6.9 g/dL (ref 6.0–8.5)
eGFR: 75 mL/min/{1.73_m2} (ref >59)

## 2023-03-16 LAB — CBC
Hematocrit: 44.3 % (ref 37.5–51.0)
Hemoglobin: 15.2 g/dL (ref 13.0–17.7)
MCH: 32.5 pg (ref 26.6–33.0)
MCHC: 34.3 g/dL (ref 31.5–35.7)
MCV: 95 fL (ref 79–97)
Platelets: 226 10*3/uL (ref 150–450)
RBC: 4.67 x10E6/uL (ref 4.14–5.80)
RDW: 11.8 % (ref 11.6–15.4)
WBC: 7.4 10*3/uL (ref 3.4–10.8)

## 2023-03-16 LAB — LIPID PANEL
Chol/HDL Ratio: 2.6 ratio (ref 0.0–5.0)
Cholesterol, Total: 150 mg/dL (ref 100–199)
HDL: 58 mg/dL (ref >39)
LDL Chol Calc (NIH): 78 mg/dL (ref 0–99)
Triglycerides: 68 mg/dL (ref 0–149)
VLDL Cholesterol Cal: 14 mg/dL (ref 5–40)

## 2023-03-16 LAB — MICROALBUMIN / CREATININE URINE RATIO
Creatinine, Urine: 126.4 mg/dL
Microalb/Creat Ratio: 48 mg/g creat — ABNORMAL HIGH (ref 0–29)
Microalbumin, Urine: 60.8 ug/mL

## 2023-03-16 LAB — HEMOGLOBIN A1C
Est. average glucose Bld gHb Est-mCnc: 131 mg/dL
Hgb A1c MFr Bld: 6.2 % — ABNORMAL HIGH (ref 4.8–5.6)

## 2023-03-16 LAB — PSA TOTAL (REFLEX TO FREE): Prostate Specific Ag, Serum: 0.5 ng/mL (ref 0.0–4.0)

## 2023-03-20 ENCOUNTER — Other Ambulatory Visit: Payer: Self-pay | Admitting: Family Medicine

## 2023-03-20 ENCOUNTER — Encounter: Payer: Self-pay | Admitting: Family Medicine

## 2023-03-20 DIAGNOSIS — R809 Proteinuria, unspecified: Secondary | ICD-10-CM

## 2023-03-20 DIAGNOSIS — I10 Essential (primary) hypertension: Secondary | ICD-10-CM

## 2023-03-20 MED ORDER — LISINOPRIL-HYDROCHLOROTHIAZIDE 20-25 MG PO TABS
1.0000 | ORAL_TABLET | Freq: Every day | ORAL | 3 refills | Status: DC
Start: 2023-03-20 — End: 2023-03-28

## 2023-03-25 ENCOUNTER — Telehealth: Payer: Self-pay | Admitting: Family Medicine

## 2023-03-25 NOTE — Telephone Encounter (Signed)
Pt. Called to review labs again. States the "nurse that gave me last labs said something about dialysis and I've been worried ever since." Instructed pt. PCP does not mention dialysis in his lab review. Pt. States "ok I feel better."

## 2023-03-25 NOTE — Telephone Encounter (Signed)
States he will talk to PCP at next visit.

## 2023-03-28 ENCOUNTER — Encounter: Payer: Self-pay | Admitting: Family Medicine

## 2023-03-28 ENCOUNTER — Ambulatory Visit: Payer: BC Managed Care – PPO | Admitting: Family Medicine

## 2023-03-28 ENCOUNTER — Ambulatory Visit: Payer: Self-pay

## 2023-03-28 VITALS — BP 112/73 | HR 84 | Temp 98.0°F | Resp 12 | Ht 66.0 in | Wt 169.0 lb

## 2023-03-28 DIAGNOSIS — R350 Frequency of micturition: Secondary | ICD-10-CM

## 2023-03-28 DIAGNOSIS — R110 Nausea: Secondary | ICD-10-CM

## 2023-03-28 DIAGNOSIS — R351 Nocturia: Secondary | ICD-10-CM

## 2023-03-28 DIAGNOSIS — I10 Essential (primary) hypertension: Secondary | ICD-10-CM

## 2023-03-28 LAB — POCT URINALYSIS DIPSTICK
Bilirubin, UA: NEGATIVE
Blood, UA: NEGATIVE
Glucose, UA: NEGATIVE
Ketones, UA: POSITIVE
Leukocytes, UA: NEGATIVE
Nitrite, UA: NEGATIVE
Protein, UA: NEGATIVE
Spec Grav, UA: <=1.005 — AB (ref 1.010–1.025)
Urobilinogen, UA: 0.2 E.U./dL
pH, UA: 6 (ref 5.0–8.0)

## 2023-03-28 MED ORDER — LISINOPRIL-HYDROCHLOROTHIAZIDE 10-12.5 MG PO TABS
1.0000 | ORAL_TABLET | Freq: Every day | ORAL | Status: DC
Start: 2023-03-28 — End: 2024-02-06

## 2023-03-28 NOTE — Telephone Encounter (Signed)
Pt is calling back to speak with a nurse. Pt states that he has concerns with his medications. Pt sates that the medications is: glipiZIDE (GLUCOTROL XL) 2.5 MG 24 hr tablet and a fluid pill that he cannot remember the name. Pt states that he is having nausea, cannot sleep, and having cramps. Please advise.    Chief Complaint: Pt. Feels like he is having side effects from Zestoretic - nausea, urinary frequency, cramping. Symptoms: Above Frequency: Last week Pertinent Negatives: Patient denies  Disposition: [] ED /[] Urgent Care (no appt availability in office) / [x] Appointment(In office/virtual)/ []  [facility]/ [] Home Care/ [] Refused Recommended Disposition /[] [facility]/ []  Follow-up with PCP Additional Notes: Instructed to call back with worsening of symptoms.  Reason for Disposition  [1] Caller has URGENT medicine question about med that PCP or specialist prescribed AND [2] triager unable to answer question  Answer Assessment - Initial Assessment Questions 1. NAME of MEDICINE: "What medicine(s) are you calling about?"     Zestoretic 2. QUESTION: "What is your question?" (e.g., double dose of medicine, side effect)     Side effects 3. PRESCRIBER: "Who prescribed the medicine?" Reason: if prescribed by specialist, call should be referred to that group.     Dr. Sherrie Mustache 4. SYMPTOMS: "Do you have any symptoms?" If Yes, ask: "What symptoms are you having?"  "How bad are the symptoms (e.g., mild, moderate, severe)     Nausea,muscle cramps, frequent voiding, BP 93/62  Today 137/78 5. PREGNANCY:  "Is there any chance that you are pregnant?" "When was your last menstrual period?"     N/A  Protocols used: Medication Question Call-A-AH

## 2023-03-28 NOTE — Progress Notes (Signed)
Established patient visit   Patient: Antonio Howe   DOB: 11-21-1960   62 y.o. Male  MRN: [id number] Visit Date: 03/28/2023  Today's healthcare provider: Mila Merry, MD   No chief complaint on file.  Subjective    HPI HPI   Patient C/O frequent urination and nausea since increasing dose of lisinopril-hydrochlorothiazide. Patient reports this morning he took lower dose of medication. He reports BP 93/62 Saturday morning. He reports getting up and moving around and it did go up to 137/78. FBS was 137 yesterday morning.  Last edited by Myles Lipps, CMA on 03/28/2023  1:10 PM.    He states he immediately started feeling nauseous when a CMA made comment about drinking more water to keep from going on dialysis after his recent labs results came back. He subsequently had dose of lisinopril-hydrochlorothiazide double to 20-25 after which he started having nocturia keeping him up all night. This morning he went back down to previous dose of 10-12.5 mg lisnopril-hydrochlorothiazide   Medications: Outpatient Medications Prior to Visit  Medication Sig   aspirin 81 MG tablet Take 1 tablet by mouth daily.   B COMPLEX VITAMINS PO Take 1 capsule by mouth daily.   diazepam (VALIUM) 5 MG tablet TAKE 2 TABLETS BY MOUTH ONCE DAILY IF NEEDED   glipiZIDE (GLUCOTROL XL) 2.5 MG 24 hr tablet Take 1 tablet (2.5 mg total) by mouth daily.   glucose blood test strip 1 strip by Does not apply route daily.   HYDROcodone-acetaminophen (NORCO) 10-325 MG tablet Take 1 tablet by mouth every 6 (six) hours as needed.   meloxicam (MOBIC) 15 MG tablet Take 15 mg by mouth daily.   [DISCONTINUED] lisinopril-hydrochlorothiazide (ZESTORETIC) 20-25 MG tablet Take 1 tablet by mouth daily.   No facility-administered medications prior to visit.    Review of Systems  Constitutional:  Positive for fatigue. Negative for appetite change, chills and fever.  Respiratory:  Negative for chest tightness,  shortness of breath and wheezing.   Cardiovascular:  Negative for chest pain and palpitations.  Gastrointestinal:  Positive for nausea. Negative for abdominal pain, constipation and vomiting.  Endocrine: Positive for polyuria.      Objective    BP 112/73 (BP Location: Left Arm, Patient Position: Sitting, Cuff Size: Normal)   Pulse 84   Temp 98 F (36.7 C) (Temporal)   Resp 12   Ht 5\' 6"  (1.676 m)   Wt 169 lb (76.7 kg)   SpO2 99%   BMI 27.28 kg/m    Physical Exam   General: Appearance:    Well developed, well nourished male in no acute distress  Eyes:    PERRL, conjunctiva/corneas clear, EOM's intact       Lungs:     Clear to auscultation bilaterally, respirations unlabored  Heart:    Normal heart rate. Normal rhythm. No murmurs, rubs, or gallops.    MS:   All extremities are intact.    Neurologic:   Awake, alert, oriented x 3. No apparent focal neurological defect.         Results for orders placed or performed in visit on 03/28/23  POCT urinalysis dipstick  Result Value Ref Range   Color, UA yellow    Clarity, UA clear    Glucose, UA Negative Negative   Bilirubin, UA Negative    Ketones, UA positive    Spec Grav, UA <=1.005 (A) 1.010 - 1.025   Blood, UA Negative    pH, UA 6.0 5.0 -  8.0   Protein, UA Negative Negative   Urobilinogen, UA 0.2 0.2 or 1.0 E.U./dL   Nitrite, UA Negative    Leukocytes, UA Negative Negative   Appearance     Odor      Assessment & Plan     1. Urinary   2. Nocturia  -  Onset after double dose of lisinopril and prevent him from getting adequate sleep. Will go back down to lisinopril-hydrochlorothiazide (ZESTORETIC) 10-12.5 MG tablet; Take 1 tablet by mouth daily.  3. Nausea Multifactorial, partially related to stress and anxiety, and exacerbated by poor sleep since doubling dose of lisinopril-hydrochlorothiazide. Have gone back down to 10-12.5 as above   4. Essential (primary) hypertension Have gone back down to 10-12.5  lisinopril-hctz with today's dose. Will contact patient in another day or two and consider adding amlodipine if feeling better.         Mila Merry, MD  [facility] [phone] (phone) [phone] (fax)  [facility]

## 2023-03-29 ENCOUNTER — Encounter: Payer: Self-pay | Admitting: Family Medicine

## 2023-04-22 ENCOUNTER — Ambulatory Visit: Payer: BC Managed Care – PPO | Admitting: Family Medicine

## 2023-04-22 ENCOUNTER — Encounter: Payer: Self-pay | Admitting: Family Medicine

## 2023-04-22 VITALS — BP 119/79 | HR 82 | Temp 97.8°F | Resp 12 | Ht 66.0 in | Wt 169.6 lb

## 2023-04-22 DIAGNOSIS — I10 Essential (primary) hypertension: Secondary | ICD-10-CM

## 2023-04-22 DIAGNOSIS — R5383 Other fatigue: Secondary | ICD-10-CM | POA: Diagnosis not present

## 2023-04-22 DIAGNOSIS — F39 Unspecified mood [affective] disorder: Secondary | ICD-10-CM

## 2023-04-22 DIAGNOSIS — M5020 Other cervical disc displacement, unspecified cervical region: Secondary | ICD-10-CM

## 2023-04-22 MED ORDER — DIAZEPAM 5 MG PO TABS: ORAL_TABLET | ORAL | 3 refills | Status: DC

## 2023-04-22 MED ORDER — HYDROCODONE-ACETAMINOPHEN 10-325 MG PO TABS
1.0000 | ORAL_TABLET | Freq: Four times a day (QID) | ORAL | 0 refills | Status: DC | PRN
Start: 2023-04-22 — End: 2023-05-20

## 2023-04-22 MED ORDER — SERTRALINE HCL 25 MG PO TABS: ORAL_TABLET | ORAL | 3 refills | Status: DC

## 2023-04-22 NOTE — Progress Notes (Signed)
Established patient visit   Patient: Antonio Howe   DOB: [birth year]   62 y.o. Male  MRN: [id number] Visit Date: 04/22/2023  Today's healthcare provider: Mila Merry, MD   Chief Complaint  Patient presents with   Hypertension   Subjective    HPI HPI   03/28/23 HTN-decrease lisinopril-hydrochlorothiazide back to 10-12.5 mg due to nocturia. He reports good compliance and tolerance to medication. He reports sleeping much better now. He is checking BP at home, he reports some lows and some highs. Low-92/62, high-138/74, rarely diastolic in the 70s mostly 60s. Last edited by Myles Lipps, CMA on 04/22/2023  2:14 PM.      Main complaint today is that he is depressed and very fatigued which he relates to dissatisfaction with his job, which he is contemplating retirement from. He feels very irritable and feels like he needs to take a medication to help with his energy and ambition. Lab Results  Component Value Date   WBC 7.4 03/15/2023   HGB 15.2 03/15/2023   HCT 44.3 03/15/2023   MCV 95 03/15/2023   PLT 226 03/15/2023   Lab Results  Component Value Date   NA 137 03/15/2023   K 5.6 (H) 03/15/2023   CREATININE 1.10 03/15/2023   EGFR 75 03/15/2023   GLUCOSE 162 (H) 03/15/2023   Lab Results  Component Value Date   TSH 0.690 03/08/2022     Medications: Outpatient Medications Prior to Visit  Medication Sig   aspirin 81 MG tablet Take 1 tablet by mouth daily.   B COMPLEX VITAMINS PO Take 1 capsule by mouth daily.   diazepam (VALIUM) 5 MG tablet TAKE 2 TABLETS BY MOUTH ONCE DAILY IF NEEDED   glipiZIDE (GLUCOTROL XL) 2.5 MG 24 hr tablet Take 1 tablet (2.5 mg total) by mouth daily.   glucose blood test strip 1 strip by Does not apply route daily.   HYDROcodone-acetaminophen (NORCO) 10-325 MG tablet Take 1 tablet by mouth every 6 (six) hours as needed.   lisinopril-hydrochlorothiazide (ZESTORETIC) 10-12.5 MG tablet Take 1 tablet by mouth daily.   meloxicam (MOBIC)  15 MG tablet Take 15 mg by mouth daily. (Patient not taking: Reported on 04/22/2023)   No facility-administered medications prior to visit.        Objective    BP 119/79 (BP Location: Left Arm, Patient Position: Sitting, Cuff Size: Normal)   Pulse 82   Temp 97.8 F (36.6 C) (Temporal)   Resp 12   Ht 5\' 6"  (1.676 m)   Wt 169 lb 9.6 oz (76.9 kg)   SpO2 98%   BMI 27.37 kg/m    Physical Exam  General appearance: Well developed, well nourished male, cooperative and in no acute distress Head: Normocephalic, without obvious abnormality, atraumatic Respiratory: Respirations even and unlabored, normal respiratory rate Extremities: All extremities are intact.  Skin: Skin color, texture, turgor normal. No rashes seen  Psych: Appropriate mood and affect. Neurologic: Mental status: Alert, oriented to person, place, and time, thought content appropriate.    Assessment & Plan     1. Mood disorder (HCC) start sertraline (ZOLOFT) 25 MG tablet; Take 1/2 tablet daily for 6 days, then take 1 tablet daily for 1 week, then increase to 2 tablets daily  Dispense: 30 tablet; Refill: 3   refill- diazepam (VALIUM) 5 MG tablet; TAKE 2 TABLETS BY MOUTH ONCE DAILY IF NEEDED  Dispense: 50 tablet; Refill: 3  2. Essential (primary) hypertension Labile BP, did not tolerated increased  dose of lisinopril-hydrochlorothiazide due to nocturia. Is now back to 1 tablet daily to continue  3. Other fatigue Secondary to depression and situational stress related to his job.   4. Herniated cervical disc refill HYDROcodone-acetaminophen (NORCO) 10-325 MG tablet; Take 1 tablet by mouth every 6 (six) hours as needed.  Dispense: 120 tablet; Refill: 0  Future Appointments  Date Time Provider Department Center  05/20/2023  4:20 PM Malva Limes, MD [facility]       The entirety of the information documented in the History of Present Illness, Review of Systems and Physical Exam were personally obtained by me.  Portions of this information were initially documented by the CMA and reviewed by me for thoroughness and accuracy.     Mila Merry, MD  [facility] [phone] (phone) [phone] (fax)  [facility]

## 2023-04-22 NOTE — Patient Instructions (Addendum)
Please review the attached list of medications and notify my office if there are any errors.    

## 2023-05-20 ENCOUNTER — Ambulatory Visit (INDEPENDENT_AMBULATORY_CARE_PROVIDER_SITE_OTHER): Payer: BC Managed Care – PPO | Admitting: Family Medicine

## 2023-05-20 ENCOUNTER — Encounter: Payer: Self-pay | Admitting: Family Medicine

## 2023-05-20 VITALS — BP 135/74 | HR 80 | Temp 97.8°F | Resp 12 | Ht 66.0 in | Wt 172.8 lb

## 2023-05-20 DIAGNOSIS — F39 Unspecified mood [affective] disorder: Secondary | ICD-10-CM

## 2023-05-20 DIAGNOSIS — M5020 Other cervical disc displacement, unspecified cervical region: Secondary | ICD-10-CM | POA: Diagnosis not present

## 2023-05-20 MED ORDER — VENLAFAXINE HCL ER 37.5 MG PO CP24
37.5000 mg | ORAL_CAPSULE | Freq: Every day | ORAL | 0 refills | Status: DC
Start: 2023-05-20 — End: 2023-06-07

## 2023-05-20 MED ORDER — HYDROCODONE-ACETAMINOPHEN 10-325 MG PO TABS
1.0000 | ORAL_TABLET | Freq: Four times a day (QID) | ORAL | 0 refills | Status: DC | PRN
Start: 2023-05-20 — End: 2023-06-29

## 2023-05-20 MED ORDER — DIAZEPAM 5 MG PO TABS: ORAL_TABLET | ORAL | 3 refills | Status: DC

## 2023-05-20 NOTE — Progress Notes (Signed)
Established patient visit   Patient: Antonio Howe   DOB: 03/21/1961   62 y.o. Male  MRN: [id number] Visit Date: 05/20/2023  Today's healthcare provider: Mila Merry, MD   Chief Complaint  Patient presents with   Medical Management of Chronic Issues   Depression    Patient reports he stopped taking sertraline about one week ago. Reports he did not notice any changes in his mood while on medication. Patient reports he is ready to leave his work environment. He is planning to retire first of April.    Subjective    HPI  Followed situational stress/anxiety. Prescribed sertraline 25mg  about 4 weeks ago. Took one a day, but stopped after about 3 weeks because it didn't seem to help and felt sleeping after he started it. Would like to try something else to help with stress during the day, but also feeling lethargic with no motivation.  Medications: Outpatient Medications Prior to Visit  Medication Sig   aspirin 81 MG tablet Take 1 tablet by mouth daily.   B COMPLEX VITAMINS PO Take 1 capsule by mouth daily.   glipiZIDE (GLUCOTROL XL) 2.5 MG 24 hr tablet Take 1 tablet (2.5 mg total) by mouth daily.   glucose blood test strip 1 strip by Does not apply route daily.   lisinopril-hydrochlorothiazide (ZESTORETIC) 10-12.5 MG tablet Take 1 tablet by mouth daily.   [DISCONTINUED] diazepam (VALIUM) 5 MG tablet TAKE 2 TABLETS BY MOUTH ONCE DAILY IF NEEDED   [DISCONTINUED] HYDROcodone-acetaminophen (NORCO) 10-325 MG tablet Take 1 tablet by mouth every 6 (six) hours as needed.   [DISCONTINUED] meloxicam (MOBIC) 15 MG tablet Take 15 mg by mouth daily. (Patient not taking: Reported on 04/22/2023)   [DISCONTINUED] sertraline (ZOLOFT) 25 MG tablet Take 1/2 tablet daily for 6 days, then take 1 tablet daily for 1 week, then increase to 2 tablets daily (Patient not taking: Reported on 05/20/2023)   No facility-administered medications prior to visit.    Review of Systems     Objective    BP  135/74 (BP Location: Left Arm, Patient Position: Sitting, Cuff Size: Large)   Pulse 80   Temp 97.8 F (36.6 C) (Temporal)   Resp 12   Ht 5\' 6"  (1.676 m)   Wt 172 lb 12.8 oz (78.4 kg)   SpO2 99%   BMI 27.89 kg/m    Physical Exam   General appearance: Well developed, well nourished male, cooperative and in no acute distress Head: Normocephalic, without obvious abnormality, atraumatic Respiratory: Respirations even and unlabored, normal respiratory rate Extremities: All extremities are intact.  Skin: Skin color, texture, turgor normal. No rashes seen  Psych: Appropriate mood and affect. Neurologic: Mental status: Alert, oriented to person, place, and time, thought content appropriate.   Assessment & Plan     1. Mood disorder (HCC) Stopped sertraline 25 because it didn't seem to help, but felt sleepier when he was taking it.   Try venlafaxine XR (EFFEXOR XR) 37.5 MG 24 hr capsule; Take 1 capsule (37.5 mg total) by mouth daily with breakfast.  Dispense: 60 capsule; Refill: 0 Advised to increase to 2 capsule every morning after the first week, and anticipated change to 75mg  capsules with refill  refill diazepam (VALIUM) 5 MG tablet; TAKE 2 TABLETS BY MOUTH ONCE DAILY IF NEEDED  Dispense: 50 tablet; Refill: 3  2. Herniated cervical disc Refill  HYDROcodone-acetaminophen (NORCO) 10-325 MG tablet; Take 1 tablet by mouth every 6 (six) hours as needed.  Dispense:  120 tablet; Refill: 0         Mila Merry, MD  [facility] [phone] (phone) [phone] (fax)  [facility]

## 2023-06-06 ENCOUNTER — Other Ambulatory Visit: Payer: Self-pay | Admitting: Family Medicine

## 2023-06-06 DIAGNOSIS — F39 Unspecified mood [affective] disorder: Secondary | ICD-10-CM

## 2023-06-06 NOTE — Telephone Encounter (Signed)
Pt stated he was instructed to increase the medication to 2 pills daily after 1 week so he now needs a refill. Pt stated he only has enough medication to last until tomorrow.  Medication Refill - Medication: venlafaxine XR (EFFEXOR XR) 37.5 MG 24 hr capsule   Has the patient contacted their pharmacy? Yes.    Preferred Pharmacy (with phone number or street name):  [id number] [place], [place] - [address] Phone: [phone]  Fax: [phone]     Has the patient been seen for an appointment in the last year OR does the patient have an upcoming appointment? Yes.    Agent: Please be advised that RX refills may take up to 3 business days. We ask that you follow-up with your pharmacy.

## 2023-06-07 ENCOUNTER — Telehealth: Payer: Self-pay | Admitting: Family Medicine

## 2023-06-07 ENCOUNTER — Other Ambulatory Visit: Payer: Self-pay | Admitting: Family Medicine

## 2023-06-07 MED ORDER — VENLAFAXINE HCL ER 75 MG PO CP24: 75.0000 mg | ORAL_CAPSULE | Freq: Every day | ORAL | 3 refills | Status: DC

## 2023-06-07 NOTE — Telephone Encounter (Signed)
Pt is calling to check on the status of his refill for venlafaxine XR (EFFEXOR XR) 37.5 MG 24 hr capsule pt says he is completely out of medication and wants to know when he can expect this fill because he doesn't want to skip days.

## 2023-06-29 ENCOUNTER — Other Ambulatory Visit: Payer: Self-pay | Admitting: Family Medicine

## 2023-06-29 DIAGNOSIS — M5020 Other cervical disc displacement, unspecified cervical region: Secondary | ICD-10-CM

## 2023-06-29 DIAGNOSIS — F39 Unspecified mood [affective] disorder: Secondary | ICD-10-CM

## 2023-06-29 NOTE — Telephone Encounter (Signed)
Medication Refill - Medication: HYDROcodone-acetaminophen (NORCO) 10-325 MG tablet [[id number]] diazepam (VALIUM) 5 MG tablet [[id number]]   Has the patient contacted their pharmacy? Yes.     (Agent: If yes, when and what did the pharmacy advise?) Contact PCP   Preferred Pharmacy (with phone number or street name): [id number] [place], [place] - [address]   Has the patient been seen for an appointment in the last year OR does the patient have an upcoming appointment? Yes.    Agent: Please be advised that RX refills may take up to 3 business days. We ask that you follow-up with your pharmacy.

## 2023-06-30 NOTE — Telephone Encounter (Signed)
Requested medication (s) are due for refill today: yes  Requested medication (s) are on the active medication list: yes  Last refill:  05/20/23  Future visit scheduled: yes  Notes to clinic:  Unable to refill per protocol, cannot delegate.      Requested Prescriptions  Pending Prescriptions Disp Refills   HYDROcodone-acetaminophen (NORCO) 10-325 MG tablet 120 tablet 0    Sig: Take 1 tablet by mouth every 6 (six) hours as needed.     Not Delegated - Analgesics:  Opioid Agonist Combinations Failed - 06/29/2023  5:46 PM      Failed - This refill cannot be delegated      Failed - Urine Drug Screen completed in last 360 days      Passed - Valid encounter within last 3 months    Recent Outpatient Visits           1 month ago Mood disorder [facility])   [place] [facility] Malva Limes, MD   2 months ago Mood disorder [facility])   [place] [facility] Malva Limes, MD   3 months ago Urinary frequency   [place] [facility] Malva Limes, MD   3 months ago Annual physical exam   [facility], MD   11 months ago Type 2 diabetes mellitus without complication, with long-term current use of insulin (HCC)   [place] [facility] Malva Limes, MD               diazepam (VALIUM) 5 MG tablet 50 tablet 3    Sig: TAKE 2 TABLETS BY MOUTH ONCE DAILY IF NEEDED     Not Delegated - Psychiatry: Anxiolytics/Hypnotics 2 Failed - 06/29/2023  5:46 PM      Failed - This refill cannot be delegated      Failed - Urine Drug Screen completed in last 360 days      Passed - Patient is not pregnant      Passed - Valid encounter within last 6 months    Recent Outpatient Visits           1 month ago Mood disorder [facility])   [facility] Malva Limes, MD   2 months ago Mood disorder [facility])   [place] [facility] Malva Limes, MD    3 months ago Urinary frequency   [facility], MD   3 months ago Annual physical exam   [facility] Malva Limes, MD   11 months ago Type 2 diabetes mellitus without complication, with long-term current use of insulin [facility])   [place] [facility] Sherrie Mustache, Demetrios Isaacs, MD

## 2023-07-01 MED ORDER — DIAZEPAM 5 MG PO TABS
ORAL_TABLET | ORAL | 3 refills | Status: DC
Start: 2023-07-01 — End: 2023-07-25

## 2023-07-01 MED ORDER — HYDROCODONE-ACETAMINOPHEN 10-325 MG PO TABS: 1.0000 | ORAL_TABLET | Freq: Four times a day (QID) | ORAL | 0 refills | Status: DC | PRN

## 2023-07-14 DIAGNOSIS — L738 Other specified follicular disorders: Secondary | ICD-10-CM | POA: Diagnosis not present

## 2023-07-14 DIAGNOSIS — Z872 Personal history of diseases of the skin and subcutaneous tissue: Secondary | ICD-10-CM | POA: Diagnosis not present

## 2023-07-14 DIAGNOSIS — Z859 Personal history of malignant neoplasm, unspecified: Secondary | ICD-10-CM | POA: Diagnosis not present

## 2023-07-14 DIAGNOSIS — L578 Other skin changes due to chronic exposure to nonionizing radiation: Secondary | ICD-10-CM | POA: Diagnosis not present

## 2023-07-25 ENCOUNTER — Other Ambulatory Visit: Payer: Self-pay | Admitting: Family Medicine

## 2023-07-25 DIAGNOSIS — M5020 Other cervical disc displacement, unspecified cervical region: Secondary | ICD-10-CM

## 2023-07-25 DIAGNOSIS — F39 Unspecified mood [affective] disorder: Secondary | ICD-10-CM

## 2023-07-25 NOTE — Telephone Encounter (Unsigned)
Copied from CRM [id number]. Topic: General - Other >> Jul 25, 2023  4:17 PM Antonio Howe wrote: Reason for CRM: Medication Refill - Most Recent Primary Care Visit:  Provider: Malva Limes Department: [facility] Visit Type: OFFICE VISIT Date: 05/20/2023  Medication: HYDROcodone-acetaminophen (NORCO) 10-325 MG tablet [[id number]]  diazepam (VALIUM) 5 MG tablet [[id number]]  Has the patient contacted their pharmacy? No (Agent: If no, request that the patient contact the pharmacy for the refill. If patient does not wish to contact the pharmacy document the reason why and proceed with request.) (Agent: If yes, when and what did the pharmacy advise?)  Is this the correct pharmacy for this prescription? Yes If no, delete pharmacy and type the correct one.  This is the patient's preferred pharmacy:  [id number] [place], [place] - [address] [place] [address] Phone: [phone] Fax: [phone]           Has the prescription been filled recently? Yes  Is the patient out of the medication? No  Has the patient been seen for an appointment in the last year OR does the patient have an upcoming appointment? Yes  Can we respond through MyChart? Yes  Agent: Please be advised that Rx refills may take up to 3 business days. We ask that you follow-up with your pharmacy.

## 2023-07-27 NOTE — Telephone Encounter (Signed)
Requested medication (s) are due for refill today: routing for review  Requested medication (s) are on the active medication list: yes  Last refill:  07/01/23  Future visit scheduled: no  Notes to clinic:  Unable to refill per protocol, cannot delegate.      Requested Prescriptions  Pending Prescriptions Disp Refills   HYDROcodone-acetaminophen (NORCO) 10-325 MG tablet 120 tablet 0    Sig: Take 1 tablet by mouth every 6 (six) hours as needed.     Not Delegated - Analgesics:  Opioid Agonist Combinations Failed - 07/25/2023  5:17 PM      Failed - This refill cannot be delegated      Failed - Urine Drug Screen completed in last 360 days      Passed - Valid encounter within last 3 months    Recent Outpatient Visits           2 months ago Mood disorder [facility])   [facility], MD   3 months ago Mood disorder [facility])   [place] [facility] Malva Limes, MD   4 months ago Urinary frequency   [place] [facility] Mal[facility] Limes, MD   4 months ago Annual physical exam   [facility], [facility]   1 year ago Type 2 diabetes mellitus without complication, with long-term current use of insulin (HCC)   [place] [facility] Malva Limes, MD               diazepam (VALIUM) 5 MG tablet 50 tablet 3    Sig: TAKE 2 TABLETS BY MOUTH ONCE DAILY IF NEEDED     Not Delegated - Psychiatry: Anxiolytics/Hypnotics 2 Failed - 07/25/2023  5:17 PM      Failed - This refill cannot be delegated      Failed - Urine Drug Screen completed in last 360 days      Passed - Patient is not pregnant      Passed - Valid encounter within last 6 months    Recent Outpatient Visits           2 months ago Mood disorder [facility])   [place] [facility] L[facility], MD   3 months ago Mood disorder [facility])   [place] [facility] Malva Limes, MD   4 months ago Urinary frequency   [place] [facility] Malva Limes, MD   4 months ago Annual physical exam   [facility] Malva L[facility], MD   1 year ago Type 2 diabetes mellitus without complication, with long-term current use of insulin [facility])   [place] [facility] Sherrie Mustache, Demetrios Isaacs, MD

## 2023-07-28 MED ORDER — HYDROCODONE-ACETAMINOPHEN 10-325 MG PO TABS: 1.0000 | ORAL_TABLET | Freq: Four times a day (QID) | ORAL | 0 refills | Status: DC | PRN

## 2023-07-28 MED ORDER — DIAZEPAM 5 MG PO TABS
ORAL_TABLET | ORAL | 3 refills | Status: DC
Start: 2023-07-28 — End: 2023-08-26

## 2023-08-15 DIAGNOSIS — L820 Inflamed seborrheic keratosis: Secondary | ICD-10-CM | POA: Diagnosis not present

## 2023-08-15 DIAGNOSIS — L821 Other seborrheic keratosis: Secondary | ICD-10-CM | POA: Diagnosis not present

## 2023-08-15 DIAGNOSIS — D485 Neoplasm of uncertain behavior of skin: Secondary | ICD-10-CM | POA: Diagnosis not present

## 2023-08-26 ENCOUNTER — Other Ambulatory Visit: Payer: Self-pay | Admitting: Family Medicine

## 2023-08-26 DIAGNOSIS — M5020 Other cervical disc displacement, unspecified cervical region: Secondary | ICD-10-CM

## 2023-08-26 DIAGNOSIS — F39 Unspecified mood [affective] disorder: Secondary | ICD-10-CM

## 2023-08-26 NOTE — Telephone Encounter (Signed)
Medication Refill -  Most Recent Primary Care Visit:  Provider: Malva Limes  Department: [facility]  Visit Type: OFFICE VISIT  Date: 05/20/2023  Medication: HYDROcodone-acetaminophen (NORCO) 10-325 MG tablet  diazepam (VALIUM) 5 MG tablet   Has the patient contacted their pharmacy? Yes  Is this the correct pharmacy for this prescription? Yes  This is the patient's preferred pharmacy:  [id number] [place] [place] - [address] [place] [address] Phone: [phone] Fax: [phone]   Has the prescription been filled recently? Yes  Is the patient out of the medication? Yes  Has the patient been seen for an appointment in the last year OR does the patient have an upcoming appointment? Yes  Can we respond through MyChart? Yes  Agent: Please be advised that Rx refills may take up to 3 business days. We ask that you follow-up with your pharmacy.

## 2023-08-26 NOTE — Telephone Encounter (Signed)
Requested medication (s) are due for refill today: yes and no  Requested medication (s) are on the active medication list: yes  Last refill:  diazepam #07/28/23 #50/3, hydrocodone 07/28/23 #120/0  Future visit scheduled: no  Notes to clinic:  Unable to refill per protocol, cannot delegate.      Requested Prescriptions  Pending Prescriptions Disp Refills   diazepam (VALIUM) 5 MG tablet 50 tablet 3    Sig: TAKE 2 TABLETS BY MOUTH ONCE DAILY IF NEEDED     Not Delegated - Psychiatry: Anxiolytics/Hypnotics 2 Failed - 08/26/2023  2:09 PM      Failed - This refill cannot be delegated      Failed - Urine Drug Screen completed in last 360 days      Passed - Patient is not pregnant      Passed - Valid encounter within last 6 months    Recent Outpatient Visits           3 months ago Mood disorder [facility])   [place] L[facility], MD   4 months ago Mood disorder [facility])   [place] Malva Limes, MD   5 months ago Urinary frequency   [place] [facility] Malva Limes, MD   5 months ago Annual physical exam   [facility], MD   1 year ago Type 2 diabetes mellitus without complication, with long-term current use of insulin (HCC)   [place] [facility] Mal[facility] L[facility], MD               HYDROcodone-acetaminophen (NORCO) 10-325 MG tablet 120 tablet 0    Sig: Take 1 tablet by mouth every 6 (six) hours as needed.     Not Delegated - Analgesics:  Opioid Agonist Combinations Failed - 08/26/2023  2:09 PM      Failed - This refill cannot be delegated      Failed - Urine Drug Screen completed in last 360 days      Failed - Valid encounter within last 3 months    Recent Outpatient Visits           3 months ago Mood disorder [facility])   [place] [facility] Malva Limes, MD   4 months ago Mood disorder [facility])   [facility] Mal[facility] L[facility], MD   5 months ago Urinary frequency   [place] [facility] Malva Limes, MD   5 months ago Annual physical exam   [facility]   1 year ago Type 2 diabetes mellitus without complication, with long-term current use of insulin [facility])   [place] [facility] Sherrie Mustache, Demetrios Isaacs, MD

## 2023-08-28 MED ORDER — HYDROCODONE-ACETAMINOPHEN 10-325 MG PO TABS: 1.0000 | ORAL_TABLET | Freq: Four times a day (QID) | ORAL | 0 refills | Status: DC | PRN

## 2023-08-28 MED ORDER — DIAZEPAM 5 MG PO TABS
ORAL_TABLET | ORAL | 3 refills | Status: DC
Start: 2023-08-28 — End: 2023-09-30

## 2023-09-13 ENCOUNTER — Telehealth: Payer: Self-pay | Admitting: *Deleted

## 2023-09-13 NOTE — Telephone Encounter (Signed)
Due to multiple unsuccessful attempts to contact pt to schedule annual lung cancer screening pt has been removed from the lung screening program.

## 2023-09-30 ENCOUNTER — Other Ambulatory Visit: Payer: Self-pay | Admitting: Family Medicine

## 2023-09-30 DIAGNOSIS — F39 Unspecified mood [affective] disorder: Secondary | ICD-10-CM

## 2023-09-30 DIAGNOSIS — M5020 Other cervical disc displacement, unspecified cervical region: Secondary | ICD-10-CM

## 2023-09-30 NOTE — Telephone Encounter (Signed)
Requested medication (s) are due for refill today: review  Requested medication (s) are on the active medication list: yes  Last refill:  Diazepam 08/28/23 #50/3, Norco 08/28/23 #120/0  Future visit scheduled: no  Notes to clinic:  Unable to refill per protocol, cannot delegate.    Requested Prescriptions  Pending Prescriptions Disp Refills   HYDROcodone-acetaminophen (NORCO) 10-325 MG tablet 120 tablet 0    Sig: Take 1 tablet by mouth every 6 (six) hours as needed.     Not Delegated - Analgesics:  Opioid Agonist Combinations Failed - 09/30/2023  3:53 PM      Failed - This refill cannot be delegated      Failed - Urine Drug Screen completed in last 360 days      Failed - Valid encounter within last 3 months    Recent Outpatient Visits           4 months ago Mood disorder [facility])   [place] [facility] L[facility], MD   5 months ago Mood disorder [facility])   [place] [facility] Malva Limes, MD   6 months ago Urinary frequency   [place] [facility] Malva Limes, MD   6 months ago Annual physical exam   [facility] Malva Limes, MD   1 year ago Type 2 diabetes mellitus without complication, with long-term current use of insulin (HCC)   [place] [facility] Malva Limes, MD               diazepam (VALIUM) 5 MG tablet 50 tablet 3    Sig: TAKE 2 TABLETS BY MOUTH ONCE DAILY IF NEEDED     Not Delegated - Psychiatry: Anxiolytics/Hypnotics 2 Failed - 09/30/2023  3:53 PM      Failed - This refill cannot be delegated      Failed - Urine Drug Screen completed in last 360 days      Passed - Patient is not pregnant      Passed - Valid encounter within last 6 months    Recent Outpatient Visits           4 months ago Mood disorder [facility])   [place] [facility] Malva Limes, MD   5 months ago Mood disorder [facility])   [place] [facility] Malva Limes, MD   6 months ago Urinary frequency   [place] [facility] Malva Limes, MD   6 months ago Annual physical exam   [facility], MD   1 year ago Type 2 diabetes mellitus without complication, with long-term current use of insulin [facility])   [place] [facility] Sherrie Mustache, Demetrios Isaacs, MD

## 2023-09-30 NOTE — Telephone Encounter (Signed)
Medication Refill -  Most Recent Primary Care Visit:  Provider: Malva Limes  Department: [facility]  Visit Type: OFFICE VISIT  Date: 05/20/2023  Medication:  HYDROcodone-acetaminophen (NORCO) 10-325 MG tablet  diazepam (VALIUM) 5 MG tablet   Has the patient contacted their pharmacy? No (Agent: If no, request that the patient contact the pharmacy for the refill. If patient does not wish to contact the pharmacy document the reason why and proceed with request.) (Agent: If yes, when and what did the pharmacy advise?)  Is this the correct pharmacy for this prescription? Yes If no, delete pharmacy and type the correct one.  This is the patient's preferred pharmacy:   [id number] [place] [place] - [address] [place] [address] Phone: [phone] Fax: [phone]    Has the prescription been filled recently? Yes  Is the patient out of the medication? Yes  Has the patient been seen for an appointment in the last year OR does the patient have an upcoming appointment? Yes  Can we respond through MyChart? Yes  Agent: Please be advised that Rx refills may take up to 3 business days. We ask that you follow-up with your pharmacy.

## 2023-10-02 MED ORDER — HYDROCODONE-ACETAMINOPHEN 10-325 MG PO TABS: 1.0000 | ORAL_TABLET | Freq: Four times a day (QID) | ORAL | 0 refills | Status: DC | PRN

## 2023-10-02 MED ORDER — DIAZEPAM 5 MG PO TABS
ORAL_TABLET | ORAL | 3 refills | Status: DC
Start: 2023-10-02 — End: 2023-10-31

## 2023-10-06 LAB — HM DIABETES EYE EXAM

## 2023-10-10 ENCOUNTER — Encounter: Payer: Self-pay | Admitting: Family Medicine

## 2023-10-11 DIAGNOSIS — H43823 Vitreomacular adhesion, bilateral: Secondary | ICD-10-CM | POA: Diagnosis not present

## 2023-10-11 DIAGNOSIS — H2513 Age-related nuclear cataract, bilateral: Secondary | ICD-10-CM | POA: Diagnosis not present

## 2023-10-11 DIAGNOSIS — H353221 Exudative age-related macular degeneration, left eye, with active choroidal neovascularization: Secondary | ICD-10-CM | POA: Diagnosis not present

## 2023-10-11 DIAGNOSIS — H353112 Nonexudative age-related macular degeneration, right eye, intermediate dry stage: Secondary | ICD-10-CM | POA: Diagnosis not present

## 2023-10-31 ENCOUNTER — Other Ambulatory Visit: Payer: Self-pay | Admitting: Family Medicine

## 2023-10-31 DIAGNOSIS — F39 Unspecified mood [affective] disorder: Secondary | ICD-10-CM

## 2023-10-31 DIAGNOSIS — M5020 Other cervical disc displacement, unspecified cervical region: Secondary | ICD-10-CM

## 2023-10-31 NOTE — Telephone Encounter (Unsigned)
 Copied from CRM [id number]. Topic: General - Other >> Oct 31, 2023  3:07 PM Everette C wrote: Reason for CRM: Medication Refill - Most Recent Primary Care Visit:  Provider: Malva Limes Department: [facility] Visit Type: OFFICE VISIT Date: 05/20/2023  Medication: diazepam (VALIUM) 5 MG tablet [[id number]]  HYDROcodone-acetaminophen (NORCO) 10-325 MG tablet [[id number]]  Has the patient contacted their pharmacy? Yes (Agent: If no, request that the patient contact the pharmacy for the refill. If patient does not wish to contact the pharmacy document the reason why and proceed with request.) (Agent: If yes, when and what did the pharmacy advise?)  Is this the correct pharmacy for this prescription? Yes If no, delete pharmacy and type the correct one.  This is the patient's preferred pharmacy:  [id number] [place] [place] - [address] [place] [address] Phone: [phone] Fax: [phone]   Has the prescription been filled recently? Yes  Is the patient out of the medication? No  Has the patient been seen for an appointment in the last year OR does the patient have an upcoming appointment? Yes  Can we respond through MyChart? No  Agent: Please be advised that Rx refills may take up to 3 business days. We ask that you follow-up with your pharmacy.

## 2023-11-01 MED ORDER — DIAZEPAM 5 MG PO TABS: ORAL_TABLET | ORAL | 3 refills | Status: DC

## 2023-11-01 MED ORDER — HYDROCODONE-ACETAMINOPHEN 10-325 MG PO TABS: 1.0000 | ORAL_TABLET | Freq: Four times a day (QID) | ORAL | 0 refills | Status: DC | PRN

## 2023-11-01 NOTE — Telephone Encounter (Signed)
 Requested medication (s) are due for refill today - for review  Requested medication (s) are on the active medication list -yes  Future visit scheduled -no  Last refill: hydrocodone-acetaminophen- 10/02/23 #120                  Diazepam- 10/02/23 #50 3RF  Notes to clinic: non delegated Rx  Requested Prescriptions  Pending Prescriptions Disp Refills   diazepam (VALIUM) 5 MG tablet 50 tablet 3    Sig: TAKE 2 TABLETS BY MOUTH ONCE DAILY IF NEEDED     Not Delegated - Psychiatry: Anxiolytics/Hypnotics 2 Failed - 11/01/2023  2:43 PM      Failed - This refill cannot be delegated      Failed - Urine Drug Screen completed in last 360 days      Passed - Patient is not pregnant      Passed - Valid encounter within last 6 months    Recent Outpatient Visits           5 months ago Mood disorder [facility])   [place] [facility] Malva Limes, MD   6 months ago Mood disorder [facility])   [place] [facility] Mal[facility] Limes, MD   7 months ago Urinary frequency   [place] [facility] Malva Limes, MD   7 months ago Annual physical exam   [facility]   1 year ago Type 2 diabetes mellitus without complication, with long-term current use of insulin (HCC)   [place] [facility] Malva Limes, MD               HYDROcodone-acetaminophen (NORCO) 10-325 MG tablet 120 tablet 0    Sig: Take 1 tablet by mouth every 6 (six) hours as needed.     Not Delegated - Analgesics:  Opioid Agonist Combinations Failed - 11/01/2023  2:43 PM      Failed - This refill cannot be delegated      Failed - Urine Drug Screen completed in last 360 days      Failed - Valid encounter within last 3 months    Recent Outpatient Visits           5 months ago Mood disorder [facility])   [place] [facility] Malva Limes, MD   6 months ago Mood disorder [facility])   [place]  [facility], MD   7 months ago Urinary frequency   [place] [facility] Malva Limes, MD   7 months ago Annual physical exam   [facility]   1 year ago Type 2 diabetes mellitus without complication, with long-term current use of insulin (HCC)   [place] [facility] Malva Limes, MD                 Requested Prescriptions  Pending Prescriptions Disp Refills   diazepam (VALIUM) 5 MG tablet 50 tablet 3    Sig: TAKE 2 TABLETS BY MOUTH ONCE DAILY IF NEEDED     Not Delegated - Psychiatry: Anxiolytics/Hypnotics 2 Failed - 11/01/2023  2:43 PM      Failed - This refill cannot be delegated      Failed - Urine Drug Screen completed in last 360 days      Passed - Patient is not pregnant      Passed - Valid encounter  within last 6 months    Recent Outpatient Visits           5 months ago Mood disorder [facility])   [place] [facility] Malva Limes, MD   6 months ago Mood disorder [facility]   [place] [facility] Mal[facility] Limes, MD   7 months ago Urinary frequency   [place] [facility] Mal[facility] Limes, MD   7 months ago Annual physical exam   [facility], MD   1 year ago Type 2 diabetes mellitus without complication, with long-term current use of insulin (HCC)   [place] [facility] Mal[facility] Limes, MD               HYDROcodone-acetaminophen (NORCO) 10-325 MG tablet 120 tablet 0    Sig: Take 1 tablet by mouth every 6 (six) hours as needed.     Not Delegated - Analgesics:  Opioid Agonist Combinations Failed - 11/01/2023  2:43 PM      Failed - This refill cannot be delegated      Failed - Urine Drug Screen completed in last 360 days      Failed - Valid encounter within last 3 months    Recent Outpatient Visits           5 months ago  Mood disorder [facility])   [place] [facility] Malva Limes, MD   6 months ago Mood disorder [facility])   [place] [facility] Mal[facility] L[facility], MD   7 months ago Urinary frequency   [place] [facility] Malva Limes, MD   7 months ago Annual physical exam   [place] [facility] Malva Limes, MD   1 year ago Type 2 diabetes mellitus without complication, with long-term current use of insulin [facility])    [facility] Sherrie Mustache, Demetrios Isaacs, MD

## 2023-11-28 ENCOUNTER — Other Ambulatory Visit: Payer: Self-pay | Admitting: Family Medicine

## 2023-11-28 DIAGNOSIS — M5020 Other cervical disc displacement, unspecified cervical region: Secondary | ICD-10-CM

## 2023-11-28 DIAGNOSIS — F39 Unspecified mood [affective] disorder: Secondary | ICD-10-CM

## 2023-11-28 NOTE — Telephone Encounter (Signed)
 Copied from CRM [id number]. Topic: Clinical - Medication Refill >> Nov 28, 2023  3:54 PM Nyra Capes wrote: Most Recent Primary Care Visit:  Provider: Malva Limes  Department[facility]  Visit Type: OFFICE VISIT  Date: 05/20/2023  Medication: HYDROcodone-acetaminophen (NORCO) 10-325 MG tablet   patient is good until Thursday  diazepam (VALIUM) 5 MG tablet   Has the patient contacted their pharmacy? Yes (Agent: If no, request that the patient contact the pharmacy for the refill. If patient does not wish to contact the pharmacy document the reason why and proceed with request.) (Agent: If yes, when and what did the pharmacy advise?)  Is this the correct pharmacy for this prescription? Yes If no, delete pharmacy and type the correct one.  This is the patient's preferred pharmacy:  [id number] [place] [place] - [address] [place] [address] Phone: [phone] Fax: [phone]   Has the prescription been filled recently? Yes  Is the patient out of the medication? No  Has the patient been seen for an appointment in the last year OR does the patient have an upcoming appointment? Yes  Can we respond through MyChart? No  Agent: Please be advised that Rx refills may take up to 3 business days. We ask that you follow-up with your pharmacy.

## 2023-11-30 MED ORDER — DIAZEPAM 5 MG PO TABS: ORAL_TABLET | ORAL | 3 refills | Status: DC

## 2023-11-30 MED ORDER — HYDROCODONE-ACETAMINOPHEN 10-325 MG PO TABS: 1.0000 | ORAL_TABLET | Freq: Four times a day (QID) | ORAL | 0 refills | Status: DC | PRN

## 2023-11-30 NOTE — Telephone Encounter (Signed)
 Requested medication (s) are due for refill today: Yes  Requested medication (s) are on the active medication list: Yes  Last refill:  Valium 11/01/23 #50,3 refills, Norco 11/01/23 #120, 0 refills  Future visit scheduled: Yes  Notes to clinic:  Unable to refill per protocol, cannot delegate.      Requested Prescriptions  Pending Prescriptions Disp Refills   HYDROcodone-acetaminophen (NORCO) 10-325 MG tablet 120 tablet 0    Sig: Take 1 tablet by mouth every 6 (six) hours as needed.     Not Delegated - Analgesics:  Opioid Agonist Combinations Failed - 11/30/2023  8:48 AM      Failed - This refill cannot be delegated      Failed - Urine Drug Screen completed in last 360 days      Failed - Valid encounter within last 3 months    Recent Outpatient Visits           6 months ago Mood disorder [facility])   [place] [facility] Malva Limes, MD   7 months ago Mood disorder [facility])   [place] [facility] Malva Limes, MD   8 months ago Urinary frequency   [place] [facility] Malva Limes, MD   8 months ago Annual physical exam   [facility], MD   1 year ago Type 2 diabetes mellitus without complication, with long-term current use of insulin (HCC)   [place] [facility] Malva Limes, MD               diazepam (VALIUM) 5 MG tablet 50 tablet 3    Sig: TAKE 2 TABLETS BY MOUTH ONCE DAILY IF NEEDED     Not Delegated - Psychiatry: Anxiolytics/Hypnotics 2 Failed - 11/30/2023  8:48 AM      Failed - This refill cannot be delegated      Failed - Urine Drug Screen completed in last 360 days      Failed - Valid encounter within last 6 months    Recent Outpatient Visits           6 months ago Mood disorder [facility])   [place] [facility] Mal[facility] Limes, MD   7 months ago Mood disorder [facility])   [place] [facility] Malva Limes, MD   8 months ago Urinary frequency   [place] [facility] Malva Limes, MD   8 months ago Annual physical exam   [facility] Malva Limes, MD   1 year ago Type 2 diabetes mellitus without complication, with long-term current use of insulin [facility])   [place] [facility] Sherrie Mustache, Demetrios Isaacs, MD              Passed - Patient is not pregnant

## 2023-12-23 ENCOUNTER — Encounter: Payer: Self-pay | Admitting: Family Medicine

## 2023-12-23 ENCOUNTER — Ambulatory Visit: Admitting: Family Medicine

## 2023-12-23 VITALS — BP 137/73 | HR 81 | Ht 66.0 in | Wt 168.2 lb

## 2023-12-23 DIAGNOSIS — E1159 Type 2 diabetes mellitus with other circulatory complications: Secondary | ICD-10-CM

## 2023-12-23 DIAGNOSIS — Z7984 Long term (current) use of oral hypoglycemic drugs: Secondary | ICD-10-CM

## 2023-12-23 DIAGNOSIS — F39 Unspecified mood [affective] disorder: Secondary | ICD-10-CM | POA: Diagnosis not present

## 2023-12-23 DIAGNOSIS — I10 Essential (primary) hypertension: Secondary | ICD-10-CM

## 2023-12-23 DIAGNOSIS — E1121 Type 2 diabetes mellitus with diabetic nephropathy: Secondary | ICD-10-CM | POA: Diagnosis not present

## 2023-12-23 MED ORDER — VENLAFAXINE HCL ER 150 MG PO CP24: 150.0000 mg | ORAL_CAPSULE | Freq: Every day | ORAL | 2 refills | Status: DC

## 2023-12-23 NOTE — Patient Instructions (Signed)
 Marland Kitchen  Please review the attached list of medications and notify my office if there are any errors.   . Please bring all of your medications to every appointment so we can make sure that our medication list is the same as yours.

## 2023-12-31 LAB — HEMOGLOBIN A1C
Est. average glucose Bld gHb Est-mCnc: 137 mg/dL
Hgb A1c MFr Bld: 6.4 % — ABNORMAL HIGH (ref 4.8–5.6)

## 2023-12-31 LAB — RENAL FUNCTION PANEL
Albumin: 4.7 g/dL (ref 3.9–4.9)
BUN/Creatinine Ratio: 21 (ref 10–24)
BUN: 22 mg/dL (ref 8–27)
CO2: 23 mmol/L (ref 20–29)
Calcium: 9.5 mg/dL (ref 8.6–10.2)
Chloride: 99 mmol/L (ref 96–106)
Creatinine, Ser: 1.05 mg/dL (ref 0.76–1.27)
Glucose: 152 mg/dL — ABNORMAL HIGH (ref 70–99)
Phosphorus: 3.7 mg/dL (ref 2.8–4.1)
Potassium: 4.4 mmol/L (ref 3.5–5.2)
Sodium: 140 mmol/L (ref 134–144)
eGFR: 79 mL/min/{1.73_m2} (ref >59)

## 2024-01-01 ENCOUNTER — Encounter: Payer: Self-pay | Admitting: Family Medicine

## 2024-01-03 NOTE — Progress Notes (Signed)
 Established patient visit   Patient: Antonio Howe   DOB: 08-13-1961   63 y.o. Male  MRN: [id number] Visit Date: 12/23/2023  Today's healthcare provider: Jeralene Mom, MD   Chief Complaint  Patient presents with   Medical Management of Chronic Issues   Diabetes   Subjective    Discussed the use of AI scribe software for clinical note transcription with the patient, who gave verbal consent to proceed.  History of Present Illness   Antonio Howe is a 63 year old male who presents for follow-up diabetes, hypertension, and labile mood.   He is currently taking venlafaxine  75 mg, which has helped stabilize his mood. He started on a lower dose and was increased to the current dose. He wants a further increase in dosage, noting that while his mood has improved, he wishes it were 'just a little bit better'. No issues with sleep, stating he can sleep 'nine, ten hours'.  He monitors his blood pressure at home three to four times a week, with readings typically around 116-119/60-70 mmHg. His pulse is generally around 80 bpm, sometimes higher. He feels his 'fuse is a little longer now' due to the medication.  He is taking glipizide  regularly for his diabetes and reports no issues with hypoglycemia or other side effects. He is due for an A1c check, which will be done at the lab as the in-office machine is unavailable.       Medications: Outpatient Medications Prior to Visit  Medication Sig   aspirin 81 MG tablet Take 1 tablet by mouth daily.   B COMPLEX VITAMINS PO Take 1 capsule by mouth daily.   diazepam  (VALIUM ) 5 MG tablet TAKE 2 TABLETS BY MOUTH ONCE DAILY IF NEEDED   glipiZIDE  (GLUCOTROL  XL) 2.5 MG 24 hr tablet Take 1 tablet (2.5 mg total) by mouth daily.   glucose blood test strip 1 strip by Does not apply route daily.   HYDROcodone -acetaminophen  (NORCO) 10-325 MG tablet Take 1 tablet by mouth every 6 (six) hours as needed.   lisinopril -hydrochlorothiazide  (ZESTORETIC )  10-12.5 MG tablet Take 1 tablet by mouth daily.   [DISCONTINUED] venlafaxine  XR (EFFEXOR  XR) 75 MG 24 hr capsule Take 1 capsule (75 mg total) by mouth daily with breakfast.   No facility-administered medications prior to visit.   Review of Systems     Objective    BP 137/73   Pulse 81   Ht 5\' 6"  (1.676 m)   Wt 168 lb 3.2 oz (76.3 kg)   SpO2 97%   BMI 27.15 kg/m   Physical Exam   General appearance: Well developed, well nourished male, cooperative and in no acute distress Head: Normocephalic, without obvious abnormality, atraumatic Respiratory: Respirations even and unlabored, normal respiratory rate Extremities: All extremities are intact.  Skin: Skin color, texture, turgor normal. No rashes seen  Psych: Appropriate mood and affect. Neurologic: Mental status: Alert, oriented to person, place, and time, thought content appropriate.       Assessment & Plan       Mood disorder On venlafaxine  75 mg with improvement. Discussed increasing to 150 mg for further enhancement. Potential side effects reviewed. - Increase venlafaxine  to 150 mg daily. - Monitor for side effects such as insomnia, nervousness, or jitteriness. - Instruct him to call if side effects occur.  Type 2 Diabetes Mellitus On glipizide  without hypoglycemia or side effects. A1c needs checking. - Order A1c and kidney function tests. - Instruct him to visit the lab  for blood draw at his convenience.  Hypertension Blood pressure well-controlled with current regimen.  Follow-up Needs to schedule a physical exam in July. - Schedule physical exam in July.    Return in about 3 months (around 03/23/2024) for Yearly Physical.      Jeralene Mom, MD  [facility] [phone] (phone) [phone] (fax)  [facility]

## 2024-01-11 ENCOUNTER — Other Ambulatory Visit: Payer: Self-pay | Admitting: Family Medicine

## 2024-01-11 ENCOUNTER — Telehealth: Payer: Self-pay

## 2024-01-11 DIAGNOSIS — M5020 Other cervical disc displacement, unspecified cervical region: Secondary | ICD-10-CM

## 2024-01-11 DIAGNOSIS — F39 Unspecified mood [affective] disorder: Secondary | ICD-10-CM

## 2024-01-11 MED ORDER — VENLAFAXINE HCL ER 75 MG PO CP24: 75.0000 mg | ORAL_CAPSULE | Freq: Every day | ORAL | 1 refills | Status: DC

## 2024-01-11 NOTE — Telephone Encounter (Signed)
 Copied from CRM [id number]. Topic: Clinical - Medication Refill >> Jan 11, 2024  1:16 PM Star East wrote: Most Recent Primary Care Visit:  Provider: Lamon Pillow  [facility]  Visit Type: OFFICE VISIT  Date: 12/23/2023  Medication: HYDROcodone -acetaminophen  (NORCO) 10-325 MG tablet diazepam  (VALIUM ) 5 MG tablet  Has the patient contacted their pharmacy? No (Agent: If no, request that the patient contact the pharmacy for the refill. If patient does not wish to contact the pharmacy document the reason why and proceed with request.) (Agent: If yes, when and what did the pharmacy advise?)  Is this the correct pharmacy for this prescription? Yes If no, delete pharmacy and type the correct one.  This is the patient's preferred pharmacy:   [id number] [place] [place] - [address] [place] [address] Phone: [phone] Fax: [phone]    Has the prescription been filled recently? Yes  Is the patient out of the medication? No  Has the patient been seen for an appointment in the last year OR does the patient have an upcoming appointment? Yes  Can we respond through MyChart? Yes  Agent: Please be advised that Rx refills may take up to 3 business days. We ask that you follow-up with your pharmacy.

## 2024-01-11 NOTE — Telephone Encounter (Signed)
 Copied from CRM [id number]. Topic: Clinical - Prescription Issue >> Jan 11, 2024  1:19 PM Star East wrote: Reason for CRM: venlafaxine  XR (EFFEXOR  XR) 150 MG 24 hr capsule- Patient states the 150 mg is too strong and would like to go back to 75 mg, please call to advise [phone]

## 2024-01-13 ENCOUNTER — Other Ambulatory Visit: Payer: Self-pay | Admitting: Family Medicine

## 2024-01-13 DIAGNOSIS — M5020 Other cervical disc displacement, unspecified cervical region: Secondary | ICD-10-CM

## 2024-01-13 DIAGNOSIS — F39 Unspecified mood [affective] disorder: Secondary | ICD-10-CM

## 2024-01-13 NOTE — Telephone Encounter (Signed)
 Copied from CRM [id number]. Topic: Clinical - Medication Refill >> Jan 13, 2024 12:47 PM Rosaria Common wrote: Most Recent Primary Care Visit:  Provider: Lamon Pillow  [facility]  Visit Type: OFFICE VISIT  Date: 12/23/2023  Medication: HYDROcodone -acetaminophen  (NORCO) 10-325 MG tablet, diazepam  (VALIUM ) 5 MG tablet, venlafaxine  XR (EFFEXOR  XR) 75 MG 24  Has the patient contacted their pharmacy? Yes (Agent: If no, request that the patient contact the pharmacy for the refill. If patient does not wish to contact the pharmacy document the reason why and proceed with request.) (Agent: If yes, when and what did the pharmacy advise?)  Is this the correct pharmacy for this prescription? Yes If no, delete pharmacy and type the correct one.  This is the patient's preferred pharmacy:  [id number] [place] - [address] [place] [address] Phone: [phone] Fax: [phone]     Has the prescription been filled recently? Yes  Is the patient out of the medication? No  Has the patient been seen for an appointment in the last year OR does the patient have an upcoming appointment? Yes  Can we respond through MyChart? Yes  Agent: Please be advised that Rx refills may take up to 3 business days. We ask that you follow-up with your pharmacy.

## 2024-01-14 NOTE — Telephone Encounter (Signed)
 Requested medication (s) are due for refill today: routing for approval  Requested medication (s) are on the active medication list: yes  Last refill:  11/30/23  Future visit scheduled: yes  Notes to clinic:  Unable to refill per protocol, cannot delegate.      Requested Prescriptions  Pending Prescriptions Disp Refills   diazepam  (VALIUM ) 5 MG tablet 50 tablet 3    Sig: TAKE 2 TABLETS BY MOUTH ONCE DAILY IF NEEDED     Not Delegated - Psychiatry: Anxiolytics/Hypnotics 2 Failed - 01/14/2024 10:03 AM      Failed - This refill cannot be delegated      Failed - Urine Drug Screen completed in last 360 days      Failed - Valid encounter within last 6 months    Recent Outpatient Visits           3 weeks ago Diabetes mellitus with nephropathy [facility])   [place] [facility] Lamon Pillow, MD       Future Appointments             In 2 months Fisher, Erlinda Haws, MD [facility]            Passed - Patient is not pregnant       HYDROcodone -acetaminophen  (NORCO) 10-325 MG tablet 120 tablet 0    Sig: Take 1 tablet by mouth every 6 (six) hours as needed.     Not Delegated - Analgesics:  Opioid Agonist Combinations Failed - 01/14/2024 10:03 AM      Failed - This refill cannot be delegated      Failed - Urine Drug Screen completed in last 360 days      Failed - Valid encounter within last 3 months    Recent Outpatient Visits           3 weeks ago Diabetes mellitus with nephropathy [facility])   [place] [facility] Lamon Pillow, MD       Future Appointments             In 2 months Fisher, Erlinda Haws, MD [facility]

## 2024-01-16 ENCOUNTER — Telehealth: Payer: Self-pay | Admitting: Family Medicine

## 2024-01-16 MED ORDER — DIAZEPAM 5 MG PO TABS: ORAL_TABLET | ORAL | 3 refills | Status: DC

## 2024-01-16 MED ORDER — HYDROCODONE-ACETAMINOPHEN 10-325 MG PO TABS: 1.0000 | ORAL_TABLET | Freq: Four times a day (QID) | ORAL | 0 refills | Status: DC | PRN

## 2024-01-16 NOTE — Telephone Encounter (Signed)
 Copied from CRM [id number]. Topic: Clinical - Prescription Issue >> Jan 16, 2024  4:08 PM Stanly Early wrote: Reason for CRM:  HYDROcodone -acetaminophen  (NORCO) 10-325 MG tablet, diazepam  (VALIUM ) 5 MG tablet. Pt wants to follow up about his medication, He informed me that he called last Wednesday but the crm was created on Friday. I also informed him it can take up to three business days. He said feel free to give him a call [phone]

## 2024-01-16 NOTE — Telephone Encounter (Signed)
 Requested medication (s) are due for refill today yes/no  Requested medication (s) are on the active medication list -yes  Future visit scheduled -yes  Last refill: Diazepam - 11/30/23 #50 3RF                  Hydrocodone - 11/30/23 #120  Notes to clinic: non delegated Rx  Requested Prescriptions  Pending Prescriptions Disp Refills   diazepam  (VALIUM ) 5 MG tablet 50 tablet 3    Sig: TAKE 2 TABLETS BY MOUTH ONCE DAILY IF NEEDED     Not Delegated - Psychiatry: Anxiolytics/Hypnotics 2 Failed - 01/16/2024  2:58 PM      Failed - This refill cannot be delegated      Failed - Urine Drug Screen completed in last 360 days      Failed - Valid encounter within last 6 months    Recent Outpatient Visits           3 weeks ago Diabetes mellitus with nephropathy [facility])   [place] Lamon Pillow, MD       Future Appointments             In 2 months Fisher, Erlinda Haws, MD [facility]            Passed - Patient is not pregnant       HYDROcodone -acetaminophen  (NORCO) 10-325 MG tablet 120 tablet 0    Sig: Take 1 tablet by mouth every 6 (six) hours as needed.     Not Delegated - Analgesics:  Opioid Agonist Combinations Failed - 01/16/2024  2:58 PM      Failed - This refill cannot be delegated      Failed - Urine Drug Screen completed in last 360 days      Failed - Valid encounter within last 3 months    Recent Outpatient Visits           3 weeks ago Diabetes mellitus with nephropathy [facility])   [place] [facility] Lamon Pillow, MD       Future Appointments             In 2 months Fisher, Erlinda Haws, MD [facility], [facility]            Refused Prescriptions Disp Refills   venlafaxine  XR (EFFEXOR  XR) 75 MG 24 hr capsule 90 capsule 1    Sig: Take 1 capsule (75 mg total) by mouth daily with breakfast.     Psychiatry: Antidepressants - SNRI - desvenlafaxine & venlafaxine   Failed - 01/16/2024  2:58 PM      Failed - Valid encounter within last 6 months    Recent Outpatient Visits           3 weeks ago Diabetes mellitus with nephropathy [facility])   [place] [facility] Lamon Pillow, MD       Future Appointments             In 2 months Fisher, Erlinda Haws, MD [place] [facility]            Failed - Lipid Panel in normal range within the last 12 months    Cholesterol, Total  Date Value Ref Range Status  03/15/2023 150 100 - 199 mg/dL Final   LDL Chol Calc (NIH)  Date Value Ref Range Status  03/15/2023 78 0 - 99 mg/dL Final   HDL  Date Value Ref Range  Status  03/15/2023 58 >39 mg/dL Final   Triglycerides  Date Value Ref Range Status  03/15/2023 68 0 - 149 mg/dL Final         Passed - Cr in normal range and within 360 days    Creatinine, Ser  Date Value Ref Range Status  12/30/2023 1.05 0.76 - 1.27 mg/dL Final   Creatinine, POC  Date Value Ref Range Status  07/04/2017 n/a mg/dL Final         Passed - Last BP in normal range    BP Readings from Last 1 Encounters:  12/23/23 137/73            Requested Prescriptions  Pending Prescriptions Disp Refills   diazepam  (VALIUM ) 5 MG tablet 50 tablet 3    Sig: TAKE 2 TABLETS BY MOUTH ONCE DAILY IF NEEDED     Not Delegated - Psychiatry: Anxiolytics/Hypnotics 2 Failed - 01/16/2024  2:58 PM      Failed - This refill cannot be delegated      Failed - Urine Drug Screen completed in last 360 days      Failed - Valid encounter within last 6 months    Recent Outpatient Visits           3 weeks ago Diabetes mellitus with nephropathy [facility])   [place] [facility] Lamon Pillow, MD       Future Appointments             In 2 months Fisher, Erlinda Haws, MD [place] [facility]            Passed - Patient is not pregnant       HYDROcodone -acetaminophen  (NORCO) 10-325 MG tablet 120 tablet 0    Sig: Take  1 tablet by mouth every 6 (six) hours as needed.     Not Delegated - Analgesics:  Opioid Agonist Combinations Failed - 01/16/2024  2:58 PM      Failed - This refill cannot be delegated      Failed - Urine Drug Screen completed in last 360 days      Failed - Valid encounter within last 3 months    Recent Outpatient Visits           3 weeks ago Diabetes mellitus with nephropathy [facility])   [place] [facility] Lamon Pillow, MD       Future Appointments             In 2 months Fisher, Erlinda Haws, MD [facility]            Refused Prescriptions Disp Refills   venlafaxine  XR (EFFEXOR  XR) 75 MG 24 hr capsule 90 capsule 1    Sig: Take 1 capsule (75 mg total) by mouth daily with breakfast.     Psychiatry: Antidepressants - SNRI - desvenlafaxine & venlafaxine  Failed - 01/16/2024  2:58 PM      Failed - Valid encounter within last 6 months    Recent Outpatient Visits           3 weeks ago Diabetes mellitus with nephropathy [facility])   [place] [facility] Lamon Pillow, MD       Future Appointments             In 2 months Fisher, Erlinda Haws, MD [facility]            Failed - Lipid Panel in  normal range within the last 12 months    Cholesterol, Total  Date Value Ref Range Status  03/15/2023 150 100 - 199 mg/dL Final   LDL Chol Calc (NIH)  Date Value Ref Range Status  03/15/2023 78 0 - 99 mg/dL Final   HDL  Date Value Ref Range Status  03/15/2023 58 >39 mg/dL Final   Triglycerides  Date Value Ref Range Status  03/15/2023 68 0 - 149 mg/dL Final         Passed - Cr in normal range and within 360 days    Creatinine, Ser  Date Value Ref Range Status  12/30/2023 1.05 0.76 - 1.27 mg/dL Final   Creatinine, POC  Date Value Ref Range Status  07/04/2017 n/a mg/dL Final         Passed - Last BP in normal range    BP Readings from Last 1 Encounters:  12/23/23 137/73

## 2024-01-16 NOTE — Telephone Encounter (Signed)
 Rx 01/11/24 #90 1RF- too soon Requested Prescriptions  Pending Prescriptions Disp Refills   diazepam  (VALIUM ) 5 MG tablet 50 tablet 3    Sig: TAKE 2 TABLETS BY MOUTH ONCE DAILY IF NEEDED     Not Delegated - Psychiatry: Anxiolytics/Hypnotics 2 Failed - 01/16/2024  2:58 PM      Failed - This refill cannot be delegated      Failed - Urine Drug Screen completed in last 360 days      Failed - Valid encounter within last 6 months    Recent Outpatient Visits           3 weeks ago Diabetes mellitus with nephropathy [facility])   [place] [facility] Lamon Pillow, MD       Future Appointments             In 2 months Fisher, Erlinda Haws, MD [facility], [facility]            Passed - Patient is not pregnant       HYDROcodone -acetaminophen  (NORCO) 10-325 MG tablet 120 tablet 0    Sig: Take 1 tablet by mouth every 6 (six) hours as needed.     Not Delegated - Analgesics:  Opioid Agonist Combinations Failed - 01/16/2024  2:58 PM      Failed - This refill cannot be delegated      Failed - Urine Drug Screen completed in last 360 days      Failed - Valid encounter within last 3 months    Recent Outpatient Visits           3 weeks ago Diabetes mellitus with nephropathy (HCC)   [place] [facility] Lamon Pillow, MD       Future Appointments             In 2 months Fisher, Erlinda Haws, MD [facility]            Refused Prescriptions Disp Refills   venlafaxine  XR (EFFEXOR  XR) 75 MG 24 hr capsule 90 capsule 1    Sig: Take 1 capsule (75 mg total) by mouth daily with breakfast.     Psychiatry: Antidepressants - SNRI - desvenlafaxine & venlafaxine  Failed - 01/16/2024  2:58 PM      Failed - Valid encounter within last 6 months    Recent Outpatient Visits           3 weeks ago Diabetes mellitus with nephropathy [facility])   [place] [facility] Lamon Pillow, MD       Future  Appointments             In 2 months Fisher, Erlinda Haws, MD [facility], [facility]            Failed - Lipid Panel in normal range within the last 12 months    Cholesterol, Total  Date Value Ref Range Status  03/15/2023 150 100 - 199 mg/dL Final   LDL Chol Calc (NIH)  Date Value Ref Range Status  03/15/2023 78 0 - 99 mg/dL Final   HDL  Date Value Ref Range Status  03/15/2023 58 >39 mg/dL Final   Triglycerides  Date Value Ref Range Status  03/15/2023 68 0 - 149 mg/dL Final         Passed - Cr in normal range and within 360 days    Creatinine, Ser  Date Value Ref Range Status  12/30/2023 1.05 0.76 - 1.27 mg/dL Final   Creatinine, POC  Date Value Ref Range Status  07/04/2017 n/a mg/dL Final         Passed - Last BP in normal range    BP Readings from Last 1 Encounters:  12/23/23 137/73

## 2024-01-16 NOTE — Telephone Encounter (Signed)
 Copied from CRM [id number]. Topic: Clinical - Medication Refill >> Jan 11, 2024  1:16 PM Star East wrote: Most Recent Primary Care Visit:  Provider: Lamon Pillow  Department: [facility]  Visit Type: OFFICE VISIT  Date: 12/23/2023  Medication: HYDROcodone -acetaminophen  (NORCO) 10-325 MG tablet diazepam  (VALIUM ) 5 MG tablet  Has the patient contacted their pharmacy? No (Agent: If no, request that the patient contact the pharmacy for the refill. If patient does not wish to contact the pharmacy document the reason why and proceed with request.) (Agent: If yes, when and what did the pharmacy advise?)  Is this the correct pharmacy for this prescription? Yes If no, delete pharmacy and type the correct one.  This is the patient's preferred pharmacy:   [id number] [place], [place] - [address] [place] [address] Phone: [phone] Fax: [phone]    Has the prescription been filled recently? Yes  Is the patient out of the medication? No  Has the patient been seen for an appointment in the last year OR does the patient have an upcoming appointment? Yes  Can we respond through MyChart? Yes  Agent: Please be advised that Rx refills may take up to 3 business days. We ask that you follow-up with your pharmacy. >> Jan 16, 2024 11:28 AM Emylou G wrote: Please call patient.. been waiting for his medication.. he is calling checking status - number on file is good.  He is a bit fustrated how it keeps happening.  He is out of meds

## 2024-02-06 ENCOUNTER — Other Ambulatory Visit: Payer: Self-pay | Admitting: Family Medicine

## 2024-02-06 DIAGNOSIS — R350 Frequency of micturition: Secondary | ICD-10-CM

## 2024-02-06 DIAGNOSIS — E119 Type 2 diabetes mellitus without complications: Secondary | ICD-10-CM

## 2024-02-13 ENCOUNTER — Telehealth: Payer: Self-pay

## 2024-02-13 NOTE — Telephone Encounter (Signed)
 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 Also would like to know if his pulmonary emphysema is being treated or are we just managing? Last question she asked is for patient Herniated Disc is Hydrocodone -Acetaminophen  the only medication to treat? Patient advised Joella is was taking Norco for his knee per chart I see its been given for herniated disc. Please advise.   I am to call Joella with provider response at [phone].

## 2024-02-13 NOTE — Telephone Encounter (Signed)
 Coronary artery disease and emphysema were both incidental finding on chest CT that was done for lung cancer screening. They are both asymptomatic and only thing he is taking for them is aspirin. hydrocodone /apap is the only pain medication he is prescribed.

## 2024-02-15 NOTE — Telephone Encounter (Signed)
 Spoke to [employer] and reviewed Dr. Evans Him notes.

## 2024-02-28 ENCOUNTER — Other Ambulatory Visit: Payer: Self-pay | Admitting: Family Medicine

## 2024-02-28 DIAGNOSIS — F39 Unspecified mood [affective] disorder: Secondary | ICD-10-CM

## 2024-02-28 DIAGNOSIS — M5020 Other cervical disc displacement, unspecified cervical region: Secondary | ICD-10-CM

## 2024-02-28 NOTE — Telephone Encounter (Signed)
 Copied from CRM [id number]. Topic: Clinical - Medication Refill >> Feb 28, 2024  5:41 PM Felizardo Hotter wrote: Medication: HYDROcodone -acetaminophen  (NORCO) 10-325 MG tablet and diazepam  (VALIUM ) 5 MG tablet  Has the patient contacted their pharmacy? Yes (Agent: If no, request that the patient contact the pharmacy for the refill. If patient does not wish to contact the pharmacy document the reason why and proceed with request.) (Agent: If yes, when and what did the pharmacy advise?)  This is the patient's preferred pharmacy:  [id number] [place] [place] - [address] [place] [address] Phone: [phone] Fax: [phone]  Is this the correct pharmacy for this prescription? Yes If no, delete pharmacy and type the correct one.   Has the prescription been filled recently? Yes  Is the patient out of the medication? Yes  Has the patient been seen for an appointment in the last year OR does the patient have an upcoming appointment? Yes  Can we respond through MyChart? Yes  Agent: Please be advised that Rx refills may take up to 3 business days. We ask that you follow-up with your pharmacy.

## 2024-03-01 NOTE — Telephone Encounter (Signed)
 Requested medication (s) are due for refill today: yes  Requested medication (s) are on the active medication list: yes  Last refill:  01/16/24  Future visit scheduled: yes  Notes to clinic:  Unable to refill per protocol, cannot delegate.      Requested Prescriptions  Pending Prescriptions Disp Refills   diazepam  (VALIUM ) 5 MG tablet 50 tablet 3    Sig: TAKE 2 TABLETS BY MOUTH ONCE DAILY IF NEEDED     Not Delegated - Psychiatry: Anxiolytics/Hypnotics 2 Failed - 03/01/2024  4:03 PM      Failed - This refill cannot be delegated      Failed - Urine Drug Screen completed in last 360 days      Failed - Valid encounter within last 6 months    Recent Outpatient Visits           2 months ago Diabetes mellitus with nephropathy [facility])   [place] [facility] Lamon Pillow, MD       Future Appointments             In 3 weeks Fisher, Erlinda Haws, MD [facility], [facility]            Passed - Patient is not pregnant       HYDROcodone -acetaminophen  (NORCO) 10-325 MG tablet 120 tablet 0    Sig: Take 1 tablet by mouth every 6 (six) hours as needed.     Not Delegated - Analgesics:  Opioid Agonist Combinations Failed - 03/01/2024  4:03 PM      Failed - This refill cannot be delegated      Failed - Urine Drug Screen completed in last 360 days      Failed - Valid encounter within last 3 months    Recent Outpatient Visits           2 months ago Diabetes mellitus with nephropathy [facility])   [place] [facility] Lamon Pillow, MD       Future Appointments             In 3 weeks Fisher, Erlinda Haws, MD [facility], [facility]

## 2024-03-02 MED ORDER — HYDROCODONE-ACETAMINOPHEN 10-325 MG PO TABS: 1.0000 | ORAL_TABLET | Freq: Four times a day (QID) | ORAL | 0 refills | Status: DC | PRN

## 2024-03-02 MED ORDER — DIAZEPAM 5 MG PO TABS
ORAL_TABLET | ORAL | 3 refills | Status: DC
Start: 2024-03-02 — End: 2024-04-02

## 2024-03-23 ENCOUNTER — Ambulatory Visit: Admitting: Family Medicine

## 2024-03-23 ENCOUNTER — Encounter: Payer: Self-pay | Admitting: Family Medicine

## 2024-03-23 VITALS — BP 85/58 | HR 76 | Resp 16 | Ht 66.0 in | Wt 166.0 lb

## 2024-03-23 DIAGNOSIS — I10 Essential (primary) hypertension: Secondary | ICD-10-CM

## 2024-03-23 DIAGNOSIS — F39 Unspecified mood [affective] disorder: Secondary | ICD-10-CM | POA: Diagnosis not present

## 2024-03-23 DIAGNOSIS — E1121 Type 2 diabetes mellitus with diabetic nephropathy: Secondary | ICD-10-CM

## 2024-03-23 DIAGNOSIS — I2510 Atherosclerotic heart disease of native coronary artery without angina pectoris: Secondary | ICD-10-CM

## 2024-03-23 DIAGNOSIS — T63441A Toxic effect of venom of bees, accidental (unintentional), initial encounter: Secondary | ICD-10-CM

## 2024-03-23 DIAGNOSIS — Z23 Encounter for immunization: Secondary | ICD-10-CM

## 2024-03-23 DIAGNOSIS — Z0001 Encounter for general adult medical examination with abnormal findings: Secondary | ICD-10-CM | POA: Diagnosis not present

## 2024-03-23 DIAGNOSIS — Z0000 Encounter for general adult medical examination without abnormal findings: Secondary | ICD-10-CM

## 2024-03-23 DIAGNOSIS — Z125 Encounter for screening for malignant neoplasm of prostate: Secondary | ICD-10-CM

## 2024-03-23 MED ORDER — EPINEPHRINE 0.3 MG/0.3ML IJ SOAJ: 0.3000 mg | INTRAMUSCULAR | 1 refills | Status: AC | PRN

## 2024-03-23 MED ORDER — PAROXETINE HCL 20 MG PO TABS: ORAL_TABLET | ORAL | 2 refills | Status: DC
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
## 2024-03-23 NOTE — Patient Instructions (Signed)
 Marland Kitchen  Please review the attached list of medications and notify my office if there are any errors.   . Please bring all of your medications to every appointment so we can make sure that our medication list is the same as yours.

## 2024-03-30 DIAGNOSIS — Z125 Encounter for screening for malignant neoplasm of prostate: Secondary | ICD-10-CM | POA: Diagnosis not present

## 2024-03-30 DIAGNOSIS — E1121 Type 2 diabetes mellitus with diabetic nephropathy: Secondary | ICD-10-CM | POA: Diagnosis not present

## 2024-03-30 DIAGNOSIS — I10 Essential (primary) hypertension: Secondary | ICD-10-CM | POA: Diagnosis not present

## 2024-03-30 DIAGNOSIS — I2510 Atherosclerotic heart disease of native coronary artery without angina pectoris: Secondary | ICD-10-CM | POA: Diagnosis not present

## 2024-04-02 ENCOUNTER — Other Ambulatory Visit: Payer: Self-pay | Admitting: Family Medicine

## 2024-04-02 ENCOUNTER — Ambulatory Visit: Payer: Self-pay | Admitting: Family Medicine

## 2024-04-02 DIAGNOSIS — I2510 Atherosclerotic heart disease of native coronary artery without angina pectoris: Secondary | ICD-10-CM

## 2024-04-02 DIAGNOSIS — F39 Unspecified mood [affective] disorder: Secondary | ICD-10-CM

## 2024-04-02 DIAGNOSIS — M5020 Other cervical disc displacement, unspecified cervical region: Secondary | ICD-10-CM

## 2024-04-02 LAB — CBC
Hematocrit: 45.9 % (ref 37.5–51.0)
Hemoglobin: 15.4 g/dL (ref 13.0–17.7)
MCH: 32.7 pg (ref 26.6–33.0)
MCHC: 33.6 g/dL (ref 31.5–35.7)
MCV: 98 fL — ABNORMAL HIGH (ref 79–97)
Platelets: 302 x10E3/uL (ref 150–450)
RBC: 4.71 x10E6/uL (ref 4.14–5.80)
RDW: 11.7 % (ref 11.6–15.4)
WBC: 8 x10E3/uL (ref 3.4–10.8)

## 2024-04-02 LAB — COMPREHENSIVE METABOLIC PANEL WITH GFR
ALT: 25 IU/L (ref 0–44)
AST: 18 IU/L (ref 0–40)
Albumin: 4.8 g/dL (ref 3.9–4.9)
Alkaline Phosphatase: 64 IU/L (ref 44–121)
BUN/Creatinine Ratio: 12 (ref 10–24)
BUN: 11 mg/dL (ref 8–27)
Bilirubin Total: 0.7 mg/dL (ref 0.0–1.2)
CO2: 23 mmol/L (ref 20–29)
Calcium: 9.8 mg/dL (ref 8.6–10.2)
Chloride: 99 mmol/L (ref 96–106)
Creatinine, Ser: 0.95 mg/dL (ref 0.76–1.27)
Globulin, Total: 1.9 g/dL (ref 1.5–4.5)
Glucose: 154 mg/dL — ABNORMAL HIGH (ref 70–99)
Potassium: 4.9 mmol/L (ref 3.5–5.2)
Sodium: 138 mmol/L (ref 134–144)
Total Protein: 6.7 g/dL (ref 6.0–8.5)
eGFR: 89 mL/min/1.73 (ref >59)

## 2024-04-02 LAB — PSA TOTAL (REFLEX TO FREE): Prostate Specific Ag, Serum: 0.6 ng/mL (ref 0.0–4.0)

## 2024-04-02 LAB — MICROALBUMIN / CREATININE URINE RATIO
Creatinine, Urine: 78.2 mg/dL
Microalb/Creat Ratio: 75 mg/g{creat} — ABNORMAL HIGH (ref 0–29)
Microalbumin, Urine: 58.7 ug/mL

## 2024-04-02 LAB — LIPID PANEL
Chol/HDL Ratio: 2.7 ratio (ref 0.0–5.0)
Cholesterol, Total: 124 mg/dL (ref 100–199)
HDL: 46 mg/dL (ref >39)
LDL Chol Calc (NIH): 65 mg/dL (ref 0–99)
Triglycerides: 62 mg/dL (ref 0–149)
VLDL Cholesterol Cal: 13 mg/dL (ref 5–40)

## 2024-04-02 LAB — APOLIPOPROTEIN B: Apolipoprotein B: 63 mg/dL (ref <90)

## 2024-04-02 LAB — LIPOPROTEIN A (LPA): Lipoprotein (a): <8.4 nmol/L (ref <75.0)

## 2024-04-02 NOTE — Telephone Encounter (Unsigned)
 Copied from CRM [id number]. Topic: Clinical - Medication Refill >> Apr 02, 2024  4:49 PM Turkey B wrote: Medication: HYDROcodone -acetaminophen  (NORCO) 10-325 MG tablet/diazepam  (VALIUM ) 5 MG tablet   Has the patient contacted their pharmacy? No Because of type of meds  This is the patient's preferred pharmacy:  [id number] [place] [place] - [address] [place] [address] Phone: [phone] Fax: [phone]   Is this the correct pharmacy for this prescription? yes .   Has the prescription been filled recently? no  Is the patient out of the medication? yes  Has the patient been seen for an appointment in the last year OR does the patient have an upcoming appointment? yes  Can we respond through MyChart? yes  Agent: Please be advised that Rx refills may take up to 3 business days. We ask that you follow-up with your pharmacy.

## 2024-04-03 MED ORDER — ROSUVASTATIN CALCIUM 5 MG PO TABS: 5.0000 mg | ORAL_TABLET | Freq: Every day | ORAL | 1 refills | Status: DC

## 2024-04-04 ENCOUNTER — Telehealth: Payer: Self-pay | Admitting: Family Medicine

## 2024-04-04 NOTE — Telephone Encounter (Signed)
 Requested medication (s) are due for refill today: yes  Requested medication (s) are on the active medication list: yes  Last refill:  03/02/24  Future visit scheduled: no  Notes to clinic:  Unable to refill per protocol, cannot delegate.      Requested Prescriptions  Pending Prescriptions Disp Refills   diazepam  (VALIUM ) 5 MG tablet 50 tablet 3    Sig: TAKE 2 TABLETS BY MOUTH ONCE DAILY IF NEEDED     Not Delegated - Psychiatry: Anxiolytics/Hypnotics 2 Failed - 04/04/2024  3:47 PM      Failed - This refill cannot be delegated      Failed - Urine Drug Screen completed in last 360 days      Passed - Patient is not pregnant      Passed - Valid encounter within last 6 months    Recent Outpatient Visits           1 week ago Annual physical exam   [facility] Gasper Nancyann BRAVO, MD   3 months ago Diabetes mellitus with nephropathy [facility])   [facility] Gasper Nancyann BRAVO, MD               HYDROcodone -acetaminophen  (NORCO) 10-325 MG tablet 120 tablet 0    Sig: Take 1 tablet by mouth every 6 (six) hours as needed.     Not Delegated - Analgesics:  Opioid Agonist Combinations Failed - 04/04/2024  3:47 PM      Failed - This refill cannot be delegated      Failed - Urine Drug Screen completed in last 360 days      Passed - Valid encounter within last 3 months    Recent Outpatient Visits           1 week ago Annual physical exam   [facility] Gasper Nancyann BRAVO, MD   3 months ago Diabetes mellitus with nephropathy [facility])   [place] Gasper Nancyann BRAVO, MD

## 2024-04-04 NOTE — Telephone Encounter (Signed)
 Called patient and advised

## 2024-04-04 NOTE — Telephone Encounter (Signed)
 Yes continue taking his blood pressure, the new medication is rosuvastatin  which if for cholesterol.

## 2024-04-04 NOTE — Telephone Encounter (Signed)
 Copied from CRM #[id number]. Topic: Clinical - Medication Question >> Apr 04, 2024  8:31 AM Aleatha C wrote: Reason for CRM: Patient is wanted to know if he should continue taking the blood pressure medication with the new medication that was prescribe after his blood work that was 7/18, he doesn't know the name of the medication currently because he doesn't have it on him but he would like a call back concerning this issue [phone] (M)

## 2024-04-05 MED ORDER — DIAZEPAM 5 MG PO TABS: ORAL_TABLET | ORAL | 3 refills | Status: DC

## 2024-04-05 MED ORDER — HYDROCODONE-ACETAMINOPHEN 10-325 MG PO TABS: 1.0000 | ORAL_TABLET | Freq: Four times a day (QID) | ORAL | 0 refills | Status: DC | PRN

## 2024-04-05 NOTE — Telephone Encounter (Signed)
 Patient following up on refill request. Patient aware awaiting on provider response. Patient requesting refill be placed before the weekend.

## 2024-04-15 ENCOUNTER — Other Ambulatory Visit: Payer: Self-pay | Admitting: Family Medicine

## 2024-04-15 DIAGNOSIS — F39 Unspecified mood [affective] disorder: Secondary | ICD-10-CM

## 2024-05-03 ENCOUNTER — Other Ambulatory Visit: Payer: Self-pay | Admitting: Family Medicine

## 2024-05-03 DIAGNOSIS — F39 Unspecified mood [affective] disorder: Secondary | ICD-10-CM

## 2024-05-03 DIAGNOSIS — M5020 Other cervical disc displacement, unspecified cervical region: Secondary | ICD-10-CM

## 2024-05-03 NOTE — Telephone Encounter (Signed)
 Copied from CRM #[id number]. Topic: Clinical - Medication Refill >> May 03, 2024 11:19 AM Wess RAMAN wrote: Medication: HYDROcodone -acetaminophen  (NORCO) 10-325 MG tablet  diazepam  (VALIUM ) 5 MG tablet   Has the patient contacted their pharmacy? No (Agent: If no, request that the patient contact the pharmacy for the refill. If patient does not wish to contact the pharmacy document the reason why and proceed with request.) (Agent: If yes, when and what did the pharmacy advise?)  This is the patient's preferred pharmacy:  [id number] [place], [place] - [address] [place] [address] Phone: [phone] Fax: [phone]  Is this the correct pharmacy for this prescription? Yes If no, delete pharmacy and type the correct one.   Has the prescription been filled recently? Yes  Is the patient out of the medication? No  Has the patient been seen for an appointment in the last year OR does the patient have an upcoming appointment? Yes  Can we respond through MyChart? No  Agent: Please be advised that Rx refills may take up to 3 business days. We ask that you follow-up with your pharmacy.

## 2024-05-04 NOTE — Telephone Encounter (Signed)
 Requested medication (s) are due for refill today - yes/no  Requested medication (s) are on the active medication list -yes  Future visit scheduled -no  Last refill: hydrocodone - 04/05/24 #120                 Diazepam - 04/05/24 #50 3RF- too soon  Notes to clinic: non delegated Rx  Requested Prescriptions  Pending Prescriptions Disp Refills   HYDROcodone -acetaminophen  (NORCO) 10-325 MG tablet 120 tablet 0    Sig: Take 1 tablet by mouth every 6 (six) hours as needed.     Not Delegated - Analgesics:  Opioid Agonist Combinations Failed - 05/04/2024 12:28 PM      Failed - This refill cannot be delegated      Failed - Urine Drug Screen completed in last 360 days      Passed - Valid encounter within last 3 months    Recent Outpatient Visits           1 month ago Annual physical exam   [place] [facility] Nancyann BRAVO, MD   4 months ago Diabetes mellitus with nephropathy [facility])   [place] [facility] Nancyann BRAVO, MD               diazepam  (VALIUM ) 5 MG tablet 50 tablet 3    Sig: TAKE 2 TABLETS BY MOUTH ONCE DAILY IF NEEDED     Not Delegated - Psychiatry: Anxiolytics/Hypnotics 2 Failed - 05/04/2024 12:28 PM      Failed - This refill cannot be delegated      Failed - Urine Drug Screen completed in last 360 days      Passed - Patient is not pregnant      Passed - Valid encounter within last 6 months    Recent Outpatient Visits           1 month ago Annual physical exam   [place] [facility] Nancyann BRAVO, MD   4 months ago Diabetes mellitus with nephropathy [facility])   [place] [facility] Gas[facility] Nancyann BRAVO, MD                 Requested Prescriptions  Pending Prescriptions Disp Refills   HYDROcodone -acetaminophen  (NORCO) 10-325 MG tablet 120 tablet 0    Sig: Take 1 tablet by mouth every 6 (six) hours as needed.     Not Delegated - Analgesics:  Opioid Agonist Combinations Failed -  05/04/2024 12:28 PM      Failed - This refill cannot be delegated      Failed - Urine Drug Screen completed in last 360 days      Passed - Valid encounter within last 3 months    Recent Outpatient Visits           1 month ago Annual physical exam   [place] [facility] Nancyann BRAVO, MD   4 months ago Diabetes mellitus with nephropathy [facility])   [place] [facility] Nancyann BRAVO, MD               diazepam  (VALIUM ) 5 MG tablet 50 tablet 3    Sig: TAKE 2 TABLETS BY MOUTH ONCE DAILY IF NEEDED     Not Delegated - Psychiatry: Anxiolytics/Hypnotics 2 Failed - 05/04/2024 12:28 PM      Failed - This refill cannot be delegated      Failed - Urine Drug Screen completed in last 360 days  Passed - Patient is not pregnant      Passed - Valid encounter within last 6 months    Recent Outpatient Visits           1 month ago Annual physical exam   [place] [facility] Nancyann BRAVO, MD   4 months ago Diabetes mellitus with nephropathy [facility])   [place] [facility] Gas[facility] Nancyann BRAVO, MD

## 2024-05-06 MED ORDER — DIAZEPAM 5 MG PO TABS: ORAL_TABLET | ORAL | 3 refills | Status: DC

## 2024-05-06 MED ORDER — HYDROCODONE-ACETAMINOPHEN 10-325 MG PO TABS: 1.0000 | ORAL_TABLET | Freq: Four times a day (QID) | ORAL | 0 refills | Status: DC | PRN

## 2024-05-31 ENCOUNTER — Other Ambulatory Visit: Payer: Self-pay | Admitting: Family Medicine

## 2024-05-31 DIAGNOSIS — M5020 Other cervical disc displacement, unspecified cervical region: Secondary | ICD-10-CM

## 2024-05-31 NOTE — Telephone Encounter (Signed)
 Patient stopped by the office to give me his updated Insurance card as of 04/13/24.   He is on [employer].    He is need a refill on Hydrocodone  next week (06/06/24)  and this will need to have a prior authorization done so [employer] will pay for it. He wanted us  to go ahead and start the process so he wont be without medication.

## 2024-06-04 MED ORDER — HYDROCODONE-ACETAMINOPHEN 10-325 MG PO TABS
1.0000 | ORAL_TABLET | Freq: Four times a day (QID) | ORAL | 0 refills | Status: DC | PRN
Start: 2024-06-04 — End: 2024-07-05

## 2024-06-28 ENCOUNTER — Ambulatory Visit: Admitting: Podiatry

## 2024-06-28 DIAGNOSIS — Q666 Other congenital valgus deformities of feet: Secondary | ICD-10-CM | POA: Diagnosis not present

## 2024-06-28 NOTE — Progress Notes (Signed)
 Subjective:  Patient ID: Antonio Howe, male    DOB: [birth year],  MRN: [id number]  Chief Complaint  Patient presents with   Foot Orthotics    Pt would like to talk about getting inserts for his shoes     63 y.o. male presents with the above complaint.  Patient presents with bilateral flatfoot deformity he started to have some heel and arch pain he wanted get it evaluated for orthotics has not seen anyone else prior to seeing me denies any other acute complaints would like to discuss treatment options for this pain scale 7 out of 10 dull aching nature.  Right now his pain is manageable.   Review of Systems: Negative except as noted in the HPI. Denies N/V/F/Ch.  Past Medical History:  Diagnosis Date   Dysuria    History of chicken pox    History of MRI of cervical spine 05/29/2011   Prominent left paracentral C6-C7 disk protrusion   Hypertension    Wears dentures full upper and lower    Current Outpatient Medications:    aspirin 81 MG tablet, Take 1 tablet by mouth daily., Disp: , Rfl:    B COMPLEX VITAMINS PO, Take 1 capsule by mouth daily., Disp: , Rfl:    diazepam  (VALIUM ) 5 MG tablet, TAKE 2 TABLETS BY MOUTH ONCE DAILY IF NEEDED, Disp: 50 tablet, Rfl: 3   EPINEPHrine  0.3 mg/0.3 mL IJ SOAJ injection, Inject 0.3 mg into the muscle as needed for anaphylaxis., Disp: 2 each, Rfl: 1   glipiZIDE  (GLUCOTROL  XL) 2.5 MG 24 hr tablet, TAKE 1 TABLET BY MOUTH EVERY DAY, Disp: 90 tablet, Rfl: 4   glucose blood test strip, 1 strip by Does not apply route daily., Disp: , Rfl:    HYDROcodone -acetaminophen  (NORCO) 10-325 MG tablet, Take 1 tablet by mouth every 6 (six) hours as needed., Disp: 120 tablet, Rfl: 0   lisinopril -hydrochlorothiazide  (ZESTORETIC ) 10-12.5 MG tablet, TAKE 1 TABLET BY MOUTH EVERY DAY, Disp: 90 tablet, Rfl: 4   PARoxetine  (PAXIL ) 20 MG tablet, Take 1 tablet (20 mg total) by mouth daily., Disp: 90 tablet, Rfl: 3   rosuvastatin  (CRESTOR ) 5 MG tablet, Take 1 tablet (5 mg  total) by mouth daily., Disp: 90 tablet, Rfl: 1  Social History   Tobacco Use  Smoking Status Former   Current packs/day: 0.00   Average packs/day: 0.5 packs/day for 43.0 years (21.5 ttl pk-yrs)   Types: Cigarettes   Start date: 01/20/1978   Quit date: 01/20/2021   Years since quitting: 3.4  Smokeless Tobacco Never  Tobacco Comments   started smoking at age 89    No Known Allergies Objective:  There were no vitals filed for this visit. There is no height or weight on file to calculate BMI. Constitutional Well developed. Well nourished.  Vascular Dorsalis pedis pulses palpable bilaterally. Posterior tibial pulses palpable bilaterally. Capillary refill normal to all digits.  No cyanosis or clubbing noted. Pedal hair growth normal.  Neurologic Normal speech. Oriented to person, place, and time. Epicritic sensation to light touch grossly present bilaterally.  Dermatologic Nails well groomed and normal in appearance. No open wounds. No skin lesions.  Orthopedic: Gait examination shows pes planovalgus foot structure with calcaneovalgus to many toes and is partially to recruit the arch with dorsiflexion of the hallux unable to perform single or double heel raise   Radiographs: None Assessment:  No diagnosis found. Plan:  Patient was evaluated and treated and all questions answered.  Pes planovalgus/foot deformity -I explained to  patient the etiology of pes planovalgus and relationship with heel pain/arch pain and various treatment options were discussed.  Given patient foot structure in the setting of heel pain/arch pain I believe patient will benefit from custom-made orthotics to help control the hindfoot motion support the arch of the foot and take the stress away from arches.  Patient agrees with the plan like to proceed with orthotics -Patient was casted for orthotics  -  No follow-ups on file.

## 2024-07-05 ENCOUNTER — Other Ambulatory Visit: Payer: Self-pay | Admitting: Family Medicine

## 2024-07-05 DIAGNOSIS — M5020 Other cervical disc displacement, unspecified cervical region: Secondary | ICD-10-CM

## 2024-07-05 NOTE — Telephone Encounter (Signed)
 Copied from CRM [id number]. Topic: Clinical - Medication Refill >> Jul 05, 2024  9:13 AM Selinda RAMAN wrote: Medication: HYDROcodone -acetaminophen  (NORCO) 10-325 MG tablet  Has the patient contacted their pharmacy? No   This is the patient's preferred pharmacy:  [id number] [place] [place] - [address] [place] [address] Phone: [phone] Fax: [phone]    Is this the correct pharmacy for this prescription? Yes If no, delete pharmacy and type the correct one.   Has the prescription been filled recently? No  Is the patient out of the medication? No but he will be on Saturday  Has the patient been seen for an appointment in the last year OR does the patient have an upcoming appointment? Yes  Can we respond through MyChart? No he would prefer a call or text  Please assist patient further

## 2024-07-06 NOTE — Telephone Encounter (Signed)
 Requested medications are due for refill today.  yes   Requested medications are on the active medications list.  yes   Last refill. 06/04/2024 #120 0 rf   Future visit scheduled.   yes   Notes to clinic.  Refill not delegated.     Requested Prescriptions  Pending Prescriptions Disp Refills   HYDROcodone -acetaminophen  (NORCO) 10-325 MG tablet 120 tablet 0    Sig: Take 1 tablet by mouth every 6 (six) hours as needed.     Not Delegated - Analgesics:  Opioid Agonist Combinations Failed - 07/06/2024  2:00 PM      Failed - This refill cannot be delegated      Failed - Urine Drug Screen completed in last 360 days      Failed - Valid encounter within last 3 months    Recent Outpatient Visits           3 months ago Annual physical exam   [place] [facility] Nancy[facility] BRAVO, [facility]   6 months ago Diabetes mellitus with nephropathy [facility])   [place] [facility] Nancyann BRAVO, MD

## 2024-07-06 NOTE — Telephone Encounter (Signed)
 Requested medications are due for refill today.  yes  Requested medications are on the active medications list.  yes  Last refill. 06/04/2024 #120 0 rf  Future visit scheduled.   yes  Notes to clinic.  Refill not delegated.    Requested Prescriptions  Pending Prescriptions Disp Refills   HYDROcodone -acetaminophen  (NORCO) 10-325 MG tablet 120 tablet 0    Sig: Take 1 tablet by mouth every 6 (six) hours as needed.     Not Delegated - Analgesics:  Opioid Agonist Combinations Failed - 07/06/2024  1:59 PM      Failed - This refill cannot be delegated      Failed - Urine Drug Screen completed in last 360 days      Failed - Valid encounter within last 3 months    Recent Outpatient Visits           3 months ago Annual physical exam   [place] [facility] Nancyann BRAVO, MD   6 months ago Diabetes mellitus with nephropathy [facility])   [place] [facility] Nancyann BRAVO, MD

## 2024-07-08 MED ORDER — HYDROCODONE-ACETAMINOPHEN 10-325 MG PO TABS: 1.0000 | ORAL_TABLET | Freq: Four times a day (QID) | ORAL | 0 refills | Status: DC | PRN

## 2024-07-31 ENCOUNTER — Other Ambulatory Visit: Payer: Self-pay | Admitting: Family Medicine

## 2024-07-31 DIAGNOSIS — M5020 Other cervical disc displacement, unspecified cervical region: Secondary | ICD-10-CM

## 2024-07-31 NOTE — Telephone Encounter (Signed)
 Copied from CRM [id number]. Topic: Clinical - Medication Refill >> Jul 31, 2024 12:48 PM Yolanda T wrote: Medication: HYDROcodone -acetaminophen  (NORCO) 10-325 MG tablet  Has the patient contacted their pharmacy? No This is the patient's preferred pharmacy:  [id number] [place] - [address] [place] [address] Phone: [phone] Fax: [phone]  Is this the correct pharmacy for this prescription? Yes If no, delete pharmacy and type the correct one.   Has the prescription been filled recently? Yes  Is the patient out of the medication? No  Has the patient been seen for an appointment in the last year OR does the patient have an upcoming appointment? Yes  Can we respond through MyChart? No  Agent: Please be advised that Rx refills may take up to 3 business days. We ask that you follow-up with your pharmacy.

## 2024-08-01 ENCOUNTER — Telehealth: Payer: Self-pay | Admitting: Podiatry

## 2024-08-01 NOTE — Telephone Encounter (Signed)
 Patient called in to check on the status of orthotics

## 2024-08-02 NOTE — Telephone Encounter (Signed)
 Requested medications are due for refill today.  Yes - a little early  Requested medications are on the active medications list. yes  Last refill. 07/08/2024 #120 0 rf  Future visit scheduled.   yes  Notes to clinic.  Refill/refusal not delegated.    Requested Prescriptions  Pending Prescriptions Disp Refills   HYDROcodone -acetaminophen  (NORCO) 10-325 MG tablet 120 tablet 0    Sig: Take 1 tablet by mouth every 6 (six) hours as needed.     Not Delegated - Analgesics:  Opioid Agonist Combinations Failed - 08/02/2024  5:51 PM      Failed - This refill cannot be delegated      Failed - Urine Drug Screen completed in last 360 days      Failed - Valid encounter within last 3 months    Recent Outpatient Visits           4 months ago Annual physical exam   [place] [facility] Nancyann BRAVO, MD   7 months ago Diabetes mellitus with nephropathy [facility])   [place] [facility] Gas[facility] Nancyann BRAVO, MD

## 2024-08-03 MED ORDER — HYDROCODONE-ACETAMINOPHEN 10-325 MG PO TABS: 1.0000 | ORAL_TABLET | Freq: Four times a day (QID) | ORAL | 0 refills | Status: DC | PRN

## 2024-08-16 ENCOUNTER — Ambulatory Visit (INDEPENDENT_AMBULATORY_CARE_PROVIDER_SITE_OTHER): Admitting: Podiatry

## 2024-08-16 DIAGNOSIS — Q666 Other congenital valgus deformities of feet: Secondary | ICD-10-CM

## 2024-08-17 ENCOUNTER — Telehealth: Payer: Self-pay

## 2024-08-17 NOTE — Progress Notes (Signed)
 Pharmacy Quality Measure Review  This patient is appearing on a report for being at risk of failing the adherence measure for diabetes and hypertension (ACEi/ARB) medications this calendar year.   Medication: lisinopril /hydrochlorothiazide   Last fill date: 08/02/24 for 90 day supply  Medication: glipizide  Last fill date: 08/02/24 for 90 day supply  Insurance report was not up to date. No action needed at this time.   Antonio Howe E. Marsh, PharmD, CPP Clinical Pharmacist [facility] [phone]

## 2024-08-21 NOTE — Progress Notes (Signed)
 Orthotics were dispensed they are functioning well no acute complaints.  Break-in period were discussed

## 2024-09-03 ENCOUNTER — Other Ambulatory Visit: Payer: Self-pay | Admitting: Family Medicine

## 2024-09-03 DIAGNOSIS — M5020 Other cervical disc displacement, unspecified cervical region: Secondary | ICD-10-CM

## 2024-09-03 NOTE — Telephone Encounter (Signed)
 Copied from CRM #[id number]. Topic: Clinical - Medication Refill >> Sep 03, 2024 12:44 PM Donna E wrote: Medication: HYDROcodone -acetaminophen  (NORCO) 10-325 MG tablet  Has the patient contacted their pharmacy? No Pharmacy states to always call provider   This is the patient's preferred pharmacy:   [id number] [place], [place] - [address] [place] [address] Phone: [phone] Fax: [phone]  Is this the correct pharmacy for this prescription? Yes If no, delete pharmacy and type the correct one.   Has the prescription been filled recently? Yes  Is the patient out of the medication? Yes  Has the patient been seen for an appointment in the last year OR does the patient have an upcoming appointment? Yes  Can we respond through MyChart? No  Agent: Please be advised that Rx refills may take up to 3 business days. We ask that you follow-up with your pharmacy.

## 2024-09-05 NOTE — Telephone Encounter (Signed)
 Requested medication (s) are due for refill today: Yes  Requested medication (s) are on the active medication list: Yes  Last refill:  08/03/24  Future visit scheduled: Yes  Notes to clinic:  Unable to refill per protocol, cannot delegate.      Requested Prescriptions  Pending Prescriptions Disp Refills   HYDROcodone -acetaminophen  (NORCO) 10-325 MG tablet 120 tablet 0    Sig: Take 1 tablet by mouth every 6 (six) hours as needed.     Not Delegated - Analgesics:  Opioid Agonist Combinations Failed - 09/05/2024 12:30 PM      Failed - This refill cannot be delegated      Failed - Urine Drug Screen completed in last 360 days      Failed - Valid encounter within last 3 months    Recent Outpatient Visits           5 months ago Annual physical exam   [place] [facility] Nancyann BRAVO, MD   8 months ago Diabetes mellitus with nephropathy [facility])   [place] [facility] Nancyann BRAVO, MD

## 2024-09-07 MED ORDER — HYDROCODONE-ACETAMINOPHEN 10-325 MG PO TABS: 1.0000 | ORAL_TABLET | Freq: Four times a day (QID) | ORAL | 0 refills | Status: DC | PRN

## 2024-09-15 ENCOUNTER — Other Ambulatory Visit: Payer: Self-pay | Admitting: Family Medicine

## 2024-09-15 DIAGNOSIS — I2510 Atherosclerotic heart disease of native coronary artery without angina pectoris: Secondary | ICD-10-CM

## 2024-09-19 ENCOUNTER — Encounter: Payer: Self-pay | Admitting: Family Medicine

## 2024-09-19 ENCOUNTER — Ambulatory Visit (INDEPENDENT_AMBULATORY_CARE_PROVIDER_SITE_OTHER): Admitting: Family Medicine

## 2024-09-19 ENCOUNTER — Other Ambulatory Visit: Payer: Self-pay

## 2024-09-19 VITALS — BP 122/48 | HR 89 | Resp 16 | Ht 66.0 in | Wt 163.0 lb

## 2024-09-19 DIAGNOSIS — J439 Emphysema, unspecified: Secondary | ICD-10-CM | POA: Diagnosis not present

## 2024-09-19 DIAGNOSIS — Z122 Encounter for screening for malignant neoplasm of respiratory organs: Secondary | ICD-10-CM

## 2024-09-19 DIAGNOSIS — I2510 Atherosclerotic heart disease of native coronary artery without angina pectoris: Secondary | ICD-10-CM | POA: Diagnosis not present

## 2024-09-19 DIAGNOSIS — Z87891 Personal history of nicotine dependence: Secondary | ICD-10-CM

## 2024-09-19 DIAGNOSIS — I10 Essential (primary) hypertension: Secondary | ICD-10-CM

## 2024-09-19 DIAGNOSIS — F17210 Nicotine dependence, cigarettes, uncomplicated: Secondary | ICD-10-CM | POA: Diagnosis not present

## 2024-09-19 DIAGNOSIS — F419 Anxiety disorder, unspecified: Secondary | ICD-10-CM

## 2024-09-19 DIAGNOSIS — Z7984 Long term (current) use of oral hypoglycemic drugs: Secondary | ICD-10-CM | POA: Diagnosis not present

## 2024-09-19 DIAGNOSIS — E1121 Type 2 diabetes mellitus with diabetic nephropathy: Secondary | ICD-10-CM | POA: Diagnosis not present

## 2024-09-19 MED ORDER — BUSPIRONE HCL 5 MG PO TABS: ORAL_TABLET | ORAL | 1 refills | Status: DC

## 2024-09-19 MED ORDER — LOSARTAN POTASSIUM 50 MG PO TABS: 50.0000 mg | ORAL_TABLET | Freq: Every day | ORAL | 1 refills | Status: AC

## 2024-09-19 NOTE — Patient Instructions (Signed)
 SABRA  Please review the attached list of medications and notify my office if there are any errors.   . Please bring all of your medications to every appointment so we can make sure that our medication list is the same as yours.

## 2024-09-19 NOTE — Progress Notes (Signed)
 "     Established patient visit   Patient: Antonio Howe   DOB: 1961-02-06   64 y.o. Male  MRN: [id number] Visit Date: 09/19/2024  Today's healthcare provider: Nancyann Perry, MD   Chief Complaint  Patient presents with   Medication Refill    Meds   Subjective    Discussed the use of AI scribe software for clinical note transcription with the patient, who gave verbal consent to proceed.  History of Present Illness   Antonio Howe is a 64 year old male who presents for follow up diabetes, hypertension, hyperlipidemia and CAD. He also has concerns about low blood pressure and anxiety. He was started on rosuvastatin  after last visit due to elevated ASCVD and coronary calcification on previous LDCT, which also showed emphysematous changes.   He frequently monitors his blood pressure, noting consistently low readings, typically in the 'nineties over high fifties to sixties'. This has caused significant worry, as he feels anxious about these low readings.  He experiences anxiety, particularly since retiring, attributing it to having 'too much time to sit around and think'. He discontinued Paxil  because it made him feel 'more nervous' and is seeking alternative management for his anxiety.  He underwent an eye procedure yesterday and reports significant pain, for which he takes pain medication as prescribed. The medication provides relief, although he wishes it were stronger, he adheres to the prescribed dosage. He is part of a research study for an eye condition, which he finds concerning despite being told his progress is satisfactory.  He started a vitamin supplement, ProVision, which he suspects may have caused constipation. After stopping it three to four days ago, he reports an improvement in bowel movements.  He has reduced his consumption of junk food and beer since retiring, noting a decrease in appetite and a reduction in beer intake from 'a half a dozen beers a day to maybe two or  three'.     Lab Results  Component Value Date   CHOL 124 03/30/2024   HDL 46 03/30/2024   LDLCALC 65 03/30/2024   TRIG 62 03/30/2024   CHOLHDL 2.7 03/30/2024   Lab Results  Component Value Date   HGBA1C 6.4 (H) 12/30/2023     Medications: Show/hide medication list[1] Review of Systems  Constitutional:  Negative for appetite change, chills and fever.  Respiratory:  Negative for chest tightness, shortness of breath and wheezing.   Cardiovascular:  Negative for chest pain and palpitations.  Gastrointestinal:  Negative for abdominal pain, nausea and vomiting.       Objective    BP (!) 122/48 (BP Location: Right Arm, Patient Position: Sitting, Cuff Size: Normal)   Pulse 89   Resp 16   Ht 5' 6 (1.676 m)   Wt 163 lb (73.9 kg)   SpO2 100%   BMI 26.31 kg/m   Physical Exam   General appearance: Well developed, well nourished male, cooperative and in no acute distress Head: Normocephalic, without obvious abnormality, atraumatic Respiratory: Respirations even and unlabored, normal respiratory rate Extremities: All extremities are intact.  Skin: Skin color, texture, turgor normal. No rashes seen  Psych: Appropriate mood and affect. Neurologic: Mental status: Alert, oriented to person, place, and time, thought content appropriate.    Assessment & Plan    1. Anxiety (Primary) Did no tolerate paroxetine .  - busPIRone  (BUSPAR ) 5 MG tablet; Take one tablet daily for 4 days, then take one tablet twice a day for 4 days, then 2 tablets in the  morning and 1 at night for 4 days, then 2 tablets twice a day  Dispense: 120 tablet; Refill: 1  2. Atherosclerosis of coronary artery of native heart without angina pectoris, unspecified vessel or lesion type Asymptomatic. Compliant with medication.  Continue aggressive risk factor modification.  Tolerating initiation of rosuvastatin , goal is LDL <55 - CBC - Lipid panel  3. Diabetes mellitus with nephropathy (HCC) Doing well on glipizide .  Currently on ACEI-hydrochlorothiazide  but will change to losartan  50 for nephropathy due to low blood pressure.   - Hemoglobin A1c - TSH  4. Essential (primary) hypertension Change to losartan  as above due to low blood pressure.   5. Smoking greater than 30 pack years  - Ambulatory Referral for Lung Cancer Screening [REF832]  6. Emphysema lung (HCC) Asymptomatic, encourage smoking cessation. Continue annual LDCT lungs.        Nancyann Perry, MD  [facility] [phone] (phone) [phone] (fax)  [facility]    [1]  Outpatient Medications Prior to Visit  Medication Sig   aspirin 81 MG tablet Take 1 tablet by mouth daily.   B COMPLEX VITAMINS PO Take 1 capsule by mouth daily.   diazepam  (VALIUM ) 5 MG tablet TAKE 2 TABLETS BY MOUTH ONCE DAILY IF NEEDED   EPINEPHrine  0.3 mg/0.3 mL IJ SOAJ injection Inject 0.3 mg into the muscle as needed for anaphylaxis.   glipiZIDE  (GLUCOTROL  XL) 2.5 MG 24 hr tablet TAKE 1 TABLET BY MOUTH EVERY DAY   glucose blood test strip 1 strip by Does not apply route daily.   HYDROcodone -acetaminophen  (NORCO) 10-325 MG tablet Take 1 tablet by mouth every 6 (six) hours as needed.   PARoxetine  (PAXIL ) 20 MG tablet Take 1 tablet (20 mg total) by mouth daily.   rosuvastatin  (CRESTOR ) 5 MG tablet TAKE 1 TABLET (5 MG TOTAL) BY MOUTH DAILY.   [DISCONTINUED] lisinopril -hydrochlorothiazide  (ZESTORETIC ) 10-12.5 MG tablet TAKE 1 TABLET BY MOUTH EVERY DAY   No facility-administered medications prior to visit.   "

## 2024-09-20 ENCOUNTER — Ambulatory Visit: Payer: Self-pay | Admitting: Family Medicine

## 2024-09-20 LAB — HEMOGLOBIN A1C
Est. average glucose Bld gHb Est-mCnc: 126 mg/dL
Hgb A1c MFr Bld: 6 % — ABNORMAL HIGH (ref 4.8–5.6)

## 2024-09-20 LAB — CBC
Hematocrit: 43.9 % (ref 37.5–51.0)
Hemoglobin: 14.7 g/dL (ref 13.0–17.7)
MCH: 33 pg (ref 26.6–33.0)
MCHC: 33.5 g/dL (ref 31.5–35.7)
MCV: 98 fL — ABNORMAL HIGH (ref 79–97)
Platelets: 201 x10E3/uL (ref 150–450)
RBC: 4.46 x10E6/uL (ref 4.14–5.80)
RDW: 11.9 % (ref 11.6–15.4)
WBC: 8.1 x10E3/uL (ref 3.4–10.8)

## 2024-09-20 LAB — LIPID PANEL
Chol/HDL Ratio: 1.8 ratio (ref 0.0–5.0)
Cholesterol, Total: 108 mg/dL (ref 100–199)
HDL: 61 mg/dL
LDL Chol Calc (NIH): 28 mg/dL (ref 0–99)
Triglycerides: 104 mg/dL (ref 0–149)
VLDL Cholesterol Cal: 19 mg/dL (ref 5–40)

## 2024-09-20 LAB — TSH: TSH: 0.742 u[IU]/mL (ref 0.450–4.500)

## 2024-09-28 ENCOUNTER — Telehealth: Payer: Self-pay

## 2024-09-28 NOTE — Telephone Encounter (Signed)
 Copied from CRM [id number]. Topic: Clinical - Medication Question >> Sep 28, 2024  3:49 PM Rosaria BRAVO wrote: Reason for CRM: Needs to verify when the patient started the glipizide , among other medication questions.  Best contact: [phone] Joella calling from [facility] Specialist.

## 2024-09-28 NOTE — Telephone Encounter (Signed)
 Called and verified start date based on our records

## 2024-10-03 ENCOUNTER — Telehealth: Payer: Self-pay

## 2024-10-03 NOTE — Telephone Encounter (Signed)
 Spoke with patient and scheduled Annual LCS. He has new insurance. See below.   Medicare Medicaid  A and B/HTA [id number]  AP

## 2024-10-08 ENCOUNTER — Ambulatory Visit: Payer: Self-pay | Admitting: Podiatry

## 2024-10-10 ENCOUNTER — Other Ambulatory Visit: Payer: Self-pay | Admitting: Family Medicine

## 2024-10-10 DIAGNOSIS — M5020 Other cervical disc displacement, unspecified cervical region: Secondary | ICD-10-CM

## 2024-10-10 NOTE — Telephone Encounter (Unsigned)
 Copied from CRM #[id number]. Topic: Clinical - Medication Refill >> Oct 10, 2024  3:36 PM Fonda T wrote: Medication: HYDROcodone -acetaminophen  (NORCO) 10-325 MG tablet   Has the patient contacted their pharmacy? Yes, advised to contact office   This is the patient's preferred pharmacy:  [id number] [place], [place] - [address] [place] [address] Phone: [phone] Fax: [phone]  Is this the correct pharmacy for this prescription? Yes If no, delete pharmacy and type the correct one.   Has the prescription been filled recently? Yes  Is the patient out of the medication? No, requesting refill before weather got bad  Has the patient been seen for an appointment in the last year OR does the patient have an upcoming appointment? Yes  Can we respond through MyChart? No, prefers phone at [phone]   Agent: Please be advised that Rx refills may take up to 3 business days. We ask that you follow-up with your pharmacy.

## 2024-10-11 ENCOUNTER — Other Ambulatory Visit: Payer: Self-pay | Admitting: Family Medicine

## 2024-10-11 DIAGNOSIS — F419 Anxiety disorder, unspecified: Secondary | ICD-10-CM

## 2024-10-11 DIAGNOSIS — F39 Unspecified mood [affective] disorder: Secondary | ICD-10-CM

## 2024-10-11 MED ORDER — HYDROCODONE-ACETAMINOPHEN 10-325 MG PO TABS: 1.0000 | ORAL_TABLET | Freq: Four times a day (QID) | ORAL | 0 refills | Status: AC | PRN

## 2024-10-11 NOTE — Telephone Encounter (Signed)
 LOV- 09/19/2024 NOV- None [facility]- 09/07/2024 Outpatient Medication Detail   Disp Refills Start End   HYDROcodone -acetaminophen  (NORCO) 10-325 MG tablet 120 tablet 0 09/07/2024 --   Sig - Route: Take 1 tablet by mouth every 6 (six) hours as needed. - Oral   Sent to pharmacy as: HYDROcodone -acetaminophen  (NORCO) 10-325 MG tablet   Earliest Fill Date: 09/07/2024   E-Prescribing Status: Receipt confirmed by pharmacy (09/07/2024  8:17 AM EST)

## 2024-10-11 NOTE — Telephone Encounter (Signed)
 Requested medication (s) are due for refill today- yes  Requested medication (s) are on the active medication list -yes  Future visit scheduled -no  Last refill: 09/07/24 #120  Notes to clinic: non delegated Rx  Requested Prescriptions  Pending Prescriptions Disp Refills   HYDROcodone -acetaminophen  (NORCO) 10-325 MG tablet 120 tablet 0    Sig: Take 1 tablet by mouth every 6 (six) hours as needed.     Not Delegated - Analgesics:  Opioid Agonist Combinations Failed - 10/11/2024  2:30 PM      Failed - This refill cannot be delegated      Failed - Urine Drug Screen completed in last 360 days      Passed - Valid encounter within last 3 months    Recent Outpatient Visits           3 weeks ago Anxiety   [place] [facility] Gasper Nancyann BRAVO, MD   6 months ago Annual physical exam   [place] Gasper Nancyann BRAVO, MD   9 months ago Diabetes mellitus with nephropathy [facility])   [place] [facility] Gasper Nancyann BRAVO, MD                 Requested Prescriptions  Pending Prescriptions Disp Refills   HYDROcodone -acetaminophen  (NORCO) 10-325 MG tablet 120 tablet 0    Sig: Take 1 tablet by mouth every 6 (six) hours as needed.     Not Delegated - Analgesics:  Opioid Agonist Combinations Failed - 10/11/2024  2:30 PM      Failed - This refill cannot be delegated      Failed - Urine Drug Screen completed in last 360 days      Passed - Valid encounter within last 3 months    Recent Outpatient Visits           3 weeks ago Anxiety   [facility] Gasper Nancyann BRAVO, MD   6 months ago Annual physical exam   [facility] Gasper Nancyann BRAVO, MD   9 months ago Diabetes mellitus with nephropathy [facility])   [facility] Gasper Nancyann BRAVO, MD

## 2024-10-23 ENCOUNTER — Ambulatory Visit

## 2024-10-25 ENCOUNTER — Ambulatory Visit

## 2024-12-06 ENCOUNTER — Ambulatory Visit: Payer: Self-pay | Admitting: Podiatry
